# Patient Record
Sex: Male | Born: 1980 | Race: White | Hispanic: No | Marital: Married | Smoking: Never smoker
Health system: Southern US, Community
[De-identification: ages and names within clinical notes are randomized; demographics above are authoritative.]

## PROBLEM LIST (undated history)

## (undated) DIAGNOSIS — I10 Essential (primary) hypertension: Secondary | ICD-10-CM

## (undated) DIAGNOSIS — F988 Other specified behavioral and emotional disorders with onset usually occurring in childhood and adolescence: Secondary | ICD-10-CM

## (undated) DIAGNOSIS — E669 Obesity, unspecified: Secondary | ICD-10-CM

## (undated) HISTORY — DX: Other specified behavioral and emotional disorders with onset usually occurring in childhood and adolescence: F98.8

## (undated) HISTORY — DX: Obesity, unspecified: E66.9

## (undated) HISTORY — DX: Essential (primary) hypertension: I10

---
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
## 2006-03-11 ENCOUNTER — Encounter: Payer: Self-pay | Admitting: Emergency Medicine

## 2007-11-11 HISTORY — PX: ESOPHAGOGASTRODUODENOSCOPY ENDOSCOPY: SHX5814

## 2008-10-17 ENCOUNTER — Encounter: Payer: Self-pay | Admitting: Emergency Medicine

## 2008-10-17 ENCOUNTER — Ambulatory Visit: Payer: Self-pay | Admitting: Diagnostic Radiology

## 2008-10-17 ENCOUNTER — Emergency Department (HOSPITAL_COMMUNITY): Admission: EM | Admit: 2008-10-17 | Discharge: 2008-10-17 | Payer: Self-pay | Admitting: Emergency Medicine

## 2008-10-24 ENCOUNTER — Encounter: Admission: RE | Admit: 2008-10-24 | Discharge: 2008-10-24 | Payer: Self-pay | Admitting: Gastroenterology

## 2008-11-23 ENCOUNTER — Ambulatory Visit: Payer: Self-pay | Admitting: Internal Medicine

## 2008-11-23 DIAGNOSIS — I10 Essential (primary) hypertension: Secondary | ICD-10-CM

## 2008-11-23 DIAGNOSIS — F909 Attention-deficit hyperactivity disorder, unspecified type: Secondary | ICD-10-CM | POA: Insufficient documentation

## 2008-11-23 DIAGNOSIS — E669 Obesity, unspecified: Secondary | ICD-10-CM | POA: Insufficient documentation

## 2008-11-30 ENCOUNTER — Encounter (INDEPENDENT_AMBULATORY_CARE_PROVIDER_SITE_OTHER): Payer: Self-pay | Admitting: Gastroenterology

## 2008-11-30 ENCOUNTER — Ambulatory Visit (HOSPITAL_COMMUNITY): Admission: RE | Admit: 2008-11-30 | Discharge: 2008-11-30 | Payer: Self-pay | Admitting: Gastroenterology

## 2008-12-01 ENCOUNTER — Ambulatory Visit: Payer: Self-pay | Admitting: Internal Medicine

## 2008-12-01 LAB — CONVERTED CEMR LAB
ALT: 24 units/L (ref 0–53)
AST: 25 units/L (ref 0–37)
Albumin: 4.1 g/dL (ref 3.5–5.2)
Alkaline Phosphatase: 54 units/L (ref 39–117)
BUN: 12 mg/dL (ref 6–23)
Basophils Absolute: 0 10*3/uL (ref 0.0–0.1)
Basophils Relative: 0.5 % (ref 0.0–3.0)
Bilirubin, Direct: 0.1 mg/dL (ref 0.0–0.3)
CO2: 30 meq/L (ref 19–32)
CRP, High Sensitivity: 3 (ref 0.00–5.00)
Calcium: 9.7 mg/dL (ref 8.4–10.5)
Chloride: 105 meq/L (ref 96–112)
Cholesterol: 158 mg/dL (ref 0–200)
Creatinine, Ser: 1.1 mg/dL (ref 0.4–1.5)
Eosinophils Absolute: 0.2 10*3/uL (ref 0.0–0.7)
Eosinophils Relative: 3.2 % (ref 0.0–5.0)
GFR calc Af Amer: 105 mL/min
GFR calc non Af Amer: 87 mL/min
Glucose, Bld: 94 mg/dL (ref 70–99)
HCT: 44.3 % (ref 39.0–52.0)
HDL: 32.5 mg/dL — ABNORMAL LOW (ref >39.0)
Hemoglobin: 15.4 g/dL (ref 13.0–17.0)
LDL Cholesterol: 103 mg/dL — ABNORMAL HIGH (ref 0–99)
Lymphocytes Relative: 27.7 % (ref 12.0–46.0)
MCHC: 34.7 g/dL (ref 30.0–36.0)
MCV: 89.3 fL (ref 78.0–100.0)
Monocytes Absolute: 0.5 10*3/uL (ref 0.1–1.0)
Monocytes Relative: 8.1 % (ref 3.0–12.0)
Neutro Abs: 4.1 10*3/uL (ref 1.4–7.7)
Neutrophils Relative %: 60.5 % (ref 43.0–77.0)
Platelets: 194 10*3/uL (ref 150–400)
Potassium: 4.3 meq/L (ref 3.5–5.1)
RBC: 4.95 M/uL (ref 4.22–5.81)
RDW: 11.4 % — ABNORMAL LOW (ref 11.5–14.6)
Sodium: 140 meq/L (ref 135–145)
TSH: 0.56 microintl units/mL (ref 0.35–5.50)
Total Bilirubin: 0.7 mg/dL (ref 0.3–1.2)
Total CHOL/HDL Ratio: 4.9
Total Protein: 7.3 g/dL (ref 6.0–8.3)
Triglycerides: 115 mg/dL (ref 0–149)
VLDL: 23 mg/dL (ref 0–40)
WBC: 6.7 10*3/uL (ref 4.5–10.5)

## 2008-12-18 ENCOUNTER — Telehealth: Payer: Self-pay | Admitting: Internal Medicine

## 2008-12-19 ENCOUNTER — Telehealth: Payer: Self-pay | Admitting: Internal Medicine

## 2008-12-24 ENCOUNTER — Emergency Department (HOSPITAL_COMMUNITY): Admission: EM | Admit: 2008-12-24 | Discharge: 2008-12-24 | Payer: Self-pay | Admitting: Family Medicine

## 2009-02-21 ENCOUNTER — Ambulatory Visit: Payer: Self-pay | Admitting: Internal Medicine

## 2009-04-03 ENCOUNTER — Ambulatory Visit: Payer: Self-pay | Admitting: Internal Medicine

## 2009-04-03 ENCOUNTER — Ambulatory Visit: Payer: Self-pay | Admitting: Family Medicine

## 2009-04-03 DIAGNOSIS — J029 Acute pharyngitis, unspecified: Secondary | ICD-10-CM

## 2009-04-03 LAB — CONVERTED CEMR LAB
BUN: 16 mg/dL (ref 6–23)
CO2: 28 meq/L (ref 19–32)
Calcium: 9.6 mg/dL (ref 8.4–10.5)
Chloride: 111 meq/L (ref 96–112)
Creatinine, Ser: 1.1 mg/dL (ref 0.4–1.5)
GFR calc non Af Amer: 86.11 mL/min (ref >60)
Glucose, Bld: 102 mg/dL — ABNORMAL HIGH (ref 70–99)
Potassium: 4.3 meq/L (ref 3.5–5.1)
Rapid Strep: NEGATIVE
Sodium: 143 meq/L (ref 135–145)

## 2009-04-11 ENCOUNTER — Ambulatory Visit: Payer: Self-pay | Admitting: Internal Medicine

## 2009-10-02 ENCOUNTER — Ambulatory Visit: Payer: Self-pay | Admitting: Internal Medicine

## 2009-10-02 LAB — CONVERTED CEMR LAB
BUN: 19 mg/dL (ref 6–23)
CO2: 24 meq/L (ref 19–32)
Calcium: 9.6 mg/dL (ref 8.4–10.5)
Chloride: 105 meq/L (ref 96–112)
Cholesterol: 187 mg/dL (ref 0–200)
Creatinine, Ser: 1.11 mg/dL (ref 0.40–1.50)
Glucose, Bld: 90 mg/dL (ref 70–99)
HDL: 36 mg/dL — ABNORMAL LOW (ref >39)
LDL Cholesterol: 122 mg/dL — ABNORMAL HIGH (ref 0–99)
Potassium: 4.4 meq/L (ref 3.5–5.3)
Sodium: 141 meq/L (ref 135–145)
Total CHOL/HDL Ratio: 5.2
Triglycerides: 143 mg/dL (ref <150)
VLDL: 29 mg/dL (ref 0–40)

## 2009-10-11 ENCOUNTER — Ambulatory Visit: Payer: Self-pay | Admitting: Internal Medicine

## 2009-12-15 ENCOUNTER — Emergency Department (HOSPITAL_COMMUNITY): Admission: EM | Admit: 2009-12-15 | Discharge: 2009-12-15 | Payer: Self-pay | Admitting: Emergency Medicine

## 2010-02-08 IMAGING — CR DG NECK SOFT TISSUE
1 series · 1 of 1 positions shown · non-contrast
Comparison: None

CLINICAL DATA: Food  stuck in throat.

NECK SOFT TISSUES - 1+ VIEW

[w soft tissue neck]
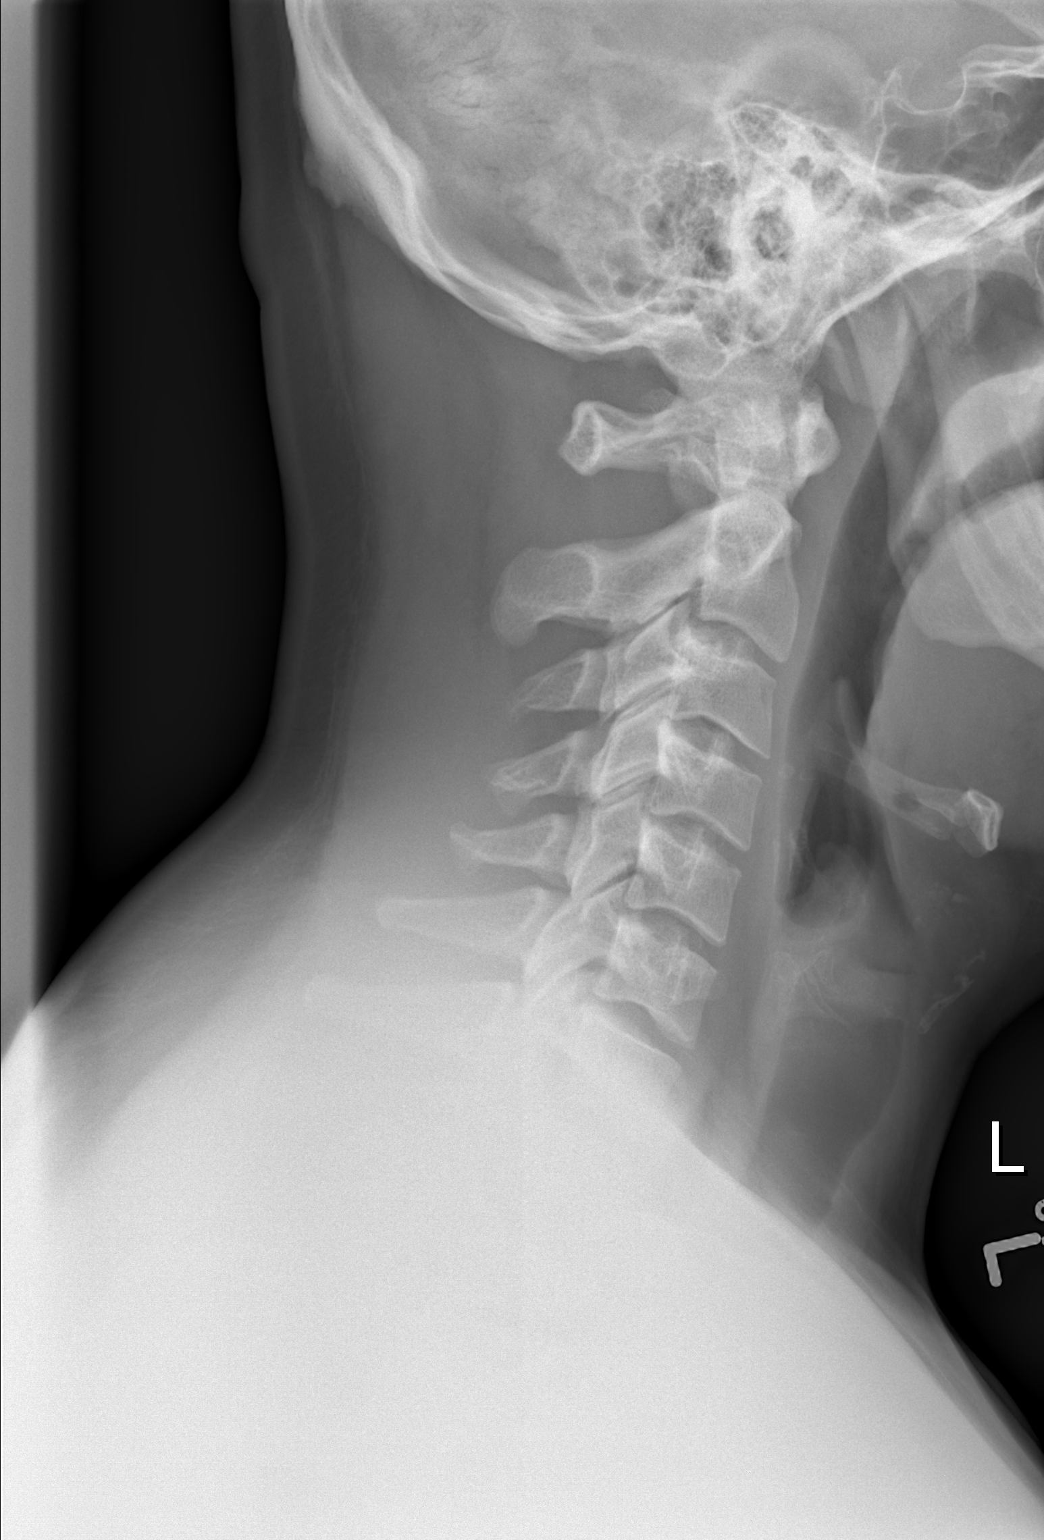

[1 of 1 positions shown; findings below may reference images not displayed]

FINDINGS: The cervical spine appears normal.  There is no
prevertebral soft tissue swelling.  The epiglottis is normal and
the airway is normal.  There is no radiopaque foreign body.
IMPRESSION: Negative

## 2010-02-22 ENCOUNTER — Ambulatory Visit: Payer: Self-pay | Admitting: Family Medicine

## 2010-02-22 LAB — CONVERTED CEMR LAB: Rapid Strep: NEGATIVE

## 2010-04-15 ENCOUNTER — Ambulatory Visit: Payer: Self-pay | Admitting: Internal Medicine

## 2010-04-15 LAB — CONVERTED CEMR LAB
BUN: 14 mg/dL (ref 6–23)
CO2: 25 meq/L (ref 19–32)
Calcium: 10 mg/dL (ref 8.4–10.5)
Chloride: 106 meq/L (ref 96–112)
Creatinine, Ser: 1.05 mg/dL (ref 0.40–1.50)
Glucose, Bld: 105 mg/dL — ABNORMAL HIGH (ref 70–99)
Potassium: 4.4 meq/L (ref 3.5–5.3)
Sodium: 141 meq/L (ref 135–145)

## 2010-04-16 ENCOUNTER — Encounter: Payer: Self-pay | Admitting: Internal Medicine

## 2010-07-25 ENCOUNTER — Ambulatory Visit: Payer: Self-pay | Admitting: Internal Medicine

## 2010-12-09 ENCOUNTER — Ambulatory Visit
Admission: RE | Admit: 2010-12-09 | Discharge: 2010-12-09 | Payer: Self-pay | Source: Home / Self Care | Attending: Family | Admitting: Family

## 2010-12-09 DIAGNOSIS — J020 Streptococcal pharyngitis: Secondary | ICD-10-CM | POA: Insufficient documentation

## 2010-12-10 NOTE — Letter (Signed)
   [place] [facility] [address] [place], [place]  [address]  [place] Phone: [phone]      April 16, 2010   Greg Santos [address] [place], [place] [address]  RE:  LAB RESULTS  Dear  [name],  The following is an interpretation of your most recent lab tests.  Please take note of any instructions provided or changes to medications that have resulted from your lab work.  ELECTROLYTES:  Good - no changes needed  KIDNEY FUNCTION TESTS:  Good - no changes needed         Sincerely Yours,    Dr. Thomos Lemons

## 2010-12-10 NOTE — Assessment & Plan Note (Signed)
Summary: 3 MONTH FOLLOW UP/MHF lm home and work # to [employer]-- Rm 3   Vital Signs:  Patient profile:   30 year old male Height:      73 inches Weight:      292.25 pounds BMI:     38.70 O2 Sat:      98 % on Room air Temp:     98.0 degrees F oral Pulse rate:   104 / minute Pulse rhythm:   regular Resp:     16 per minute BP sitting:   120 / 84  (left arm) Cuff size:   large  O2 Flow:  Room air CC: Rm  3  3 month f/u. Is Patient Diabetic? No  Does patient need assistance? Ambulation Normal Comments Needs refill on Lisinopril and Bupropion. [facility]. Nicki Guadalajara Fergerson CMA Duncan Dull)  July 25, 2010 12:05 PM    Primary Care Provider:  Dondra Spry DO  CC:  Rm  3  3 month f/u.Marland Kitchen  History of Present Illness: 30 y/o white male with hx of ADD for f/u good response to wellbutrin feels more stable.  less irritability no side effects noted.  denies insomnia  htn - stable  Preventive Screening-Counseling & Management  Alcohol-Tobacco     Smoking Status: never  Allergies: 1)  ! 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   Lungs:  normal respiratory effort and normal breath sounds.   Heart:  normal rate, regular rhythm, and no gallop.   Neurologic:  cranial nerves II-XII intact and gait normal.   Psych:  normally interactive, good eye contact, not anxious appearing, and not depressed appearing.     Impression & Recommendations:  Problem # 1:  ATTENTION DEFICIT HYPERACTIVITY DISORDER, ADULT (ICD-314.01) Assessment  Improved good response to wellbutrin.  Maintain current medication regimen.  Problem # 2:  OBESITY (ICD-278.00) Pt counseled on diet and exercise.  Ht: 73 (07/25/2010)   Wt: 292.25 (07/25/2010)   BMI: 38.70 (07/25/2010)  Complete Medication List: 1)  Prilosec Otc 20 Mg Tbec (Omeprazole magnesium) .... Take 1 tablet by mouth once a day 2)  Lisinopril 5 Mg Tabs (Lisinopril) .... One by mouth once daily 3)  Bupropion Hcl 150 Mg Xr24h-tab (Bupropion hcl) .... One by mouth once daily  Patient Instructions: 1)  Please schedule a follow-up appointment in 6 months. 2)  BMP prior to visit, ICD-9:  401.9 3)  Hepatic Panel prior to visit, ICD-9:  314.01 4)  Please return for lab work one (1) week before your next appointment.  Prescriptions: BUPROPION HCL 150 MG XR24H-TAB (BUPROPION HCL) one by mouth once daily  #90 x 1   Entered and Authorized by:   D. Thomos Lemons DO   Signed by:   D. Thomos Lemons DO on 07/25/2010   Method used:   [facility] [phone][facility] (retail)       [address]  [place], [place]  [address]       Ph: [phone]       Fax: [phone]   RxID:   [id number] LISINOPRIL 5 MG TABS (LISINOPRIL) one by mouth once daily  #90 x 1   Entered and Authorized by:   D. Thomos Lemons DO   Signed by:   D. Thomos Lemons DO on 07/25/2010   Method used:   [facility]        [facility] [phone]* (retail)       [address]       [place], [place]  [address]       Ph: [phone]       Fax: [phone]   RxID:   [id number]    Current Allergies (reviewed today): ! TETRACYCLINE

## 2010-12-10 NOTE — Assessment & Plan Note (Signed)
Summary: 6 month follow up/mhf   Vital Signs:  Patient profile:   30 year old male Height:      73 inches Weight:      287 pounds BMI:     38.00 O2 Sat:      97 % on Room air Temp:     97.7 degrees F oral Pulse rate:   80 / minute Pulse rhythm:   regular Resp:     18 per minute BP sitting:   118 / 80  (right arm) Cuff size:   large  Vitals Entered By: Glendell Docker CMA (April 15, 2010 8:03 AM)  O2 Flow:  Room air CC: Rm 2 - 6 Month Follow up Is Patient Diabetic? No   Primary Care Provider:  DThomos Lemons DO  CC:  Rm 2 - 6 Month Follow up.  History of Present Illness:  Hypertension Follow-Up      This is a 30 year old man who presents for Hypertension follow-up.  The patient denies lightheadedness and headaches.  The patient denies the following associated symptoms: chest pain.  Compliance with medications (by patient report) has been near 100%.  The patient reports that dietary compliance has been fair.  no dry cough.  he started wt watchers. lost a few pounds.  plans to continue   ADD - starting new job function.  attention is not major issue but he reports speech issues   Preventive Screening-Counseling & Management  Alcohol-Tobacco     Smoking Status: never  Allergies: 1)  ! 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   Mouth:  Oral mucosa and oropharynx without lesions or exudates.  Teeth in good repair. Lungs:  normal respiratory effort and normal breath sounds.   Heart:  normal  rate, regular rhythm, and no gallop.   Psych:  normally interactive, good eye contact, not anxious appearing, and not depressed appearing.     Impression & Recommendations:  Problem # 1:  HYPERTENSION (ICD-401.9) well controlled.  monitor BMET  His updated medication list for this problem includes:    Lisinopril 5 Mg Tabs (Lisinopril) ..... One by mouth once daily  Orders: T-Basic Metabolic Panel [id number])  BP today: 118/80 Prior BP: 140/80 (02/22/2010)  Labs Reviewed: K+: 4.4 (10/02/2009) Creat: : 1.11 (10/02/2009)   Chol: 187 (10/02/2009)   HDL: 36 (10/02/2009)   LDL: 122 (10/02/2009)   TG: 143 (10/02/2009)  Problem # 2:  ATTENTION DEFICIT HYPERACTIVITY DISORDER, ADULT (ICD-314.01) moving into new job function.  attention is not major issue but he notes some speech issues (stuttering). Trial of wellbutrin we discussed common side effects  Complete Medication List: 1)  Prilosec Otc 20 Mg Tbec (Omeprazole magnesium) .... Take 1 tablet by mouth once a day 2)  Lisinopril 5 Mg Tabs (Lisinopril) .... One by mouth once daily 3)  Bupropion Hcl 150 Mg Xr24h-tab (Bupropion hcl) .... One by mouth once daily  Patient Instructions: 1)  Please schedule a follow-up appointment  in 3 months. Prescriptions: LISINOPRIL 5 MG TABS (LISINOPRIL) one by mouth once daily  #30 x 5   Entered and Authorized by:   D. Thomos Lemons DO   Signed by:   D. Thomos Lemons DO on 04/15/2010   Method used:   [facility] [phone]* (retail)       [address]       [place], [place]  [address]       Ph: [phone]       Fax: [phone]   RxID:   [id number] BUPROPION HCL 150 MG XR24H-TAB (BUPROPION HCL) one by mouth once daily  #30 x 3   Entered and Authorized by:   D. Thomos Lemons DO   Signed by:   D. Thomos Lemons DO on 04/15/2010   Method used:   [facility] to        [facility] [phone]* (retail)       [address]        [place], [place]  [address]       Ph: [phone]       Fax: [phone]   RxID:   [id number]   Current Allergies (reviewed today): ! TETRACYCLINE

## 2010-12-10 NOTE — Assessment & Plan Note (Signed)
Summary: strep throat/mhf   Vital Signs:  Patient profile:   30 year old male Height:      73 inches Weight:      290 pounds Temp:     98.6 degrees F BP sitting:   140 / 80  Vitals Entered By: Shary Decamp (February 22, 2010 8:58 AM) CC: sore throat x 3 days   History of Present Illness: 30 yo man here today for ST.  sxs started 3 days ago.  reports throat feels swollen, saw white bumps.  no fever.  has been taking tylenol cold w/out relief, chloraseptic w/out relief.  today developed cough.  AM cough is productive, dry throughout the day.  + sick contacts.  no ear pain.  nasal congestion in AM.    Current Medications (verified): 1)  Prilosec Otc 20 Mg Tbec (Omeprazole Magnesium) .... Take 1 Tablet By Mouth Once A Day 2)  Lisinopril 5 Mg Tabs (Lisinopril) .... One By Mouth Once Daily  Allergies (verified): 1)  ! Tetracycline  Review of Systems      See HPI  Physical Exam  General:  alert, well-developed, and well-nourished.   Head:  normocephalic and atraumatic.  sinuses NTTP Eyes:  conjunctiva clear Ears:  EACs patent; TMs translucent and gray with good cone of light and bony landmarks.  Nose:  + turbinate edema Mouth:  + PND, +2 tonsilar enlargement w/ debris in the crypts, + erythema. Neck:  supple and no masses.   Lungs:  normal respiratory effort and normal breath sounds.   Heart:  normal rate, regular rhythm, and no gallop.     Impression & Recommendations:  Problem # 1:  SORE THROAT (ICD-462) Assessment Unchanged pt's strep (-).  sore throat is likely due to PND from virus/allergy combo.  stressed importance of regular OTC antihistamine use.  gave him sample of nasonex and instructions on use.  reviewed supportive care and red flags that should prompt return.  Pt expresses understanding and is in agreement w/ this plan. Orders: Rapid Strep (04540)  Complete Medication List: 1)  Prilosec Otc 20 Mg Tbec (Omeprazole magnesium) .... Take 1 tablet by mouth once a  day 2)  Lisinopril 5 Mg Tabs (Lisinopril) .... One by mouth once daily  Patient Instructions: 1)  This appears to be a virus/allergy combo 2)  Start taking your Claritin or Zyrtec daily during allergy season 3)  Use the nasal spray- 2 sprays each nostril- daily 4)  Drink plenty of fluids 5)  Mucinex will thin your congestion and drainage making it easier to cough up 6)  Ibuprofen for pain and inflammation 7)  Call with any questions or concerns 8)  Hang in there!  Laboratory Results    Other Tests  Rapid Strep: negative

## 2010-12-18 NOTE — Assessment & Plan Note (Signed)
Summary: sore swollen throat/mhf--rm 4   Vital Signs:  Patient profile:   30 year old male Height:      73 inches Weight:      310.50 pounds BMI:     41.11 Temp:     97.3 degrees F oral Pulse rate:   102 / minute Pulse rhythm:   regular Resp:     16 per minute BP sitting:   136 / 88  (right arm) Cuff size:   large  Vitals Entered By: Mervin Kung CMA Duncan Dull) (December 09, 2010 8:31 AM) CC: Pt states he has had a swollen and sore throat since last Wednesday. Is Patient Diabetic? No Pain Assessment Patient in pain? no      Comments Pt is also taking Zyrtec 1 once daily and agrees all other med doses and directions are correct. Mervin Kung CMA Duncan Dull)  December 09, 2010 8:37 AM    Primary Care Provider:  Dondra Spry DO  CC:  Pt states he has had a swollen and sore throat since last Wednesday.Marland Kitchen  History of Present Illness: [name] is a 30 year old male with cc of sore throat.  Started last wednesday.  Has improved slightly, but has not resolved.  Denies associated fever.  + Mucous dark green to light green.  Denies associated cough.  Energy is poor, slept a lot over the weekend.  Tried mucinex-d,  and tylenol with minimal relief.  Notes that his uvula feels very swollen and full in the morning and makes it hard to breath.    Allergies: 1)  ! Tetracycline  Past History:  Past Medical History: Last updated: 07/25/2010 ADD GERD  Childhood chickenpox   Hypertension   Past Surgical History: Last updated: 04/15/2010 Reconstructive left shoulder surgery 2003    Review of Systems       see HPI  Physical Exam  General:  Well-developed,well-nourished,in no acute distress; alert,appropriate and cooperative throughout examination Ears:  External ear exam shows no significant lesions or deformities.  Otoscopic examination reveals clear canals, tympanic membranes are intact bilaterally without bulging, retraction, inflammation or discharge. Hearing is grossly normal  bilaterally. Mouth:  difficulty visualizing tonsills due to narrow oropharynx.   Uvula appears moderately inflammed, but non- obstructive. Neck:  mild cervical LAD Lungs:  Normal respiratory effort, chest expands symmetrically. Lungs are clear to auscultation, no crackles or wheezes. Heart:  Normal rate and regular rhythm. S1 and S2 normal without gallop, murmur, click, rub or other extra sounds.   Impression & Recommendations:  Problem # 1:  PHARYNGITIS, STREPTOCOCCAL (ICD-034.0) Assessment New Will plan treatment with amoxicilin.  Will also treat with a short term round of prednisone to help with swelling of uvula and prevent airway compromise until acute infection is treated.  Pt notes that he has had several episodes of pharyngitis over the last few years.  He may be interested in ENT referral at some point, but declines at this time.   His updated medication list for this problem includes:    Amoxicillin 500 Mg Caps (Amoxicillin) .Marland Kitchen... 2 caps by mouth three times a day for 10 days  Complete Medication List: 1)  Prilosec Otc 20 Mg Tbec (Omeprazole magnesium) .... Take 1 tablet by mouth once a day 2)  Lisinopril 5 Mg Tabs (Lisinopril) .... One by mouth once daily 3)  Bupropion Hcl 150 Mg Xr24h-tab (Bupropion hcl) .... One by mouth once daily 4)  Amoxicillin 500 Mg Caps (Amoxicillin) .... 2 caps by mouth three  times a day for 10 days 5)  Prednisone 10 Mg Tabs (Prednisone) .... 4 tablets by mouth once daily for 4 days  Other Orders: Rapid Strep (04540)  Patient Instructions: 1)  Gargle twice daily with salt water. 2)  Take Tylenol 650mg  every 6 hours as needed for pain 3)  You may use over the counter Cepacol lozenges or Chloraseptic spray as needed for sore throat. 4)  Call if symptoms worsen or if they do not improve. 5)  Go to ER if you develop difficulty breathing. Prescriptions: PREDNISONE 10 MG TABS (PREDNISONE) 4 tablets by mouth once daily for 4 days  #16 x 0   Entered and  Authorized by:   Lemont Fillers FNP   Signed by:   Lemont Fillers FNP on 12/09/2010   Method used:   [facility]        [facility] [phone][facility] (retail)       [address]       [place], [place]  [address]       Ph: [phone]       Fax: [phone]   RxID:   [id number] AMOXICILLIN 500 MG CAPS (AMOXICILLIN) 2 caps by mouth three times a day for 10 days  #60 x 0   Entered and Authorized by:   Lemont Fillers FNP   Signed by:   Lemont Fillers FNP on 12/09/2010   Method used:   [facility]        [facility] [phone]* (retail)       [address]       [place], [place]  [address]       Ph: [phone]       Fax: [phone]   RxID:   [id number]    Orders Added: 1)  Rapid Strep [55732] 2)  Est. Patient Level III [20254]    Current Allergies (reviewed today): ! TETRACYCLINE  Laboratory Results   Date/Time Reported: Mervin Kung CMA (AAMA)  December 09, 2010 8:45 AM   Other Tests  Rapid Strep: positive  Kit Test Internal QC: Positive   (Normal Range: Negative)

## 2011-01-21 ENCOUNTER — Encounter: Payer: Self-pay | Admitting: Internal Medicine

## 2011-01-21 LAB — CONVERTED CEMR LAB
AST: 26 units/L (ref 0–37)
Alkaline Phosphatase: 54 units/L (ref 39–117)
Bilirubin, Direct: 0.1 mg/dL (ref 0.0–0.3)
Potassium: 4.2 meq/L (ref 3.5–5.3)
Sodium: 140 meq/L (ref 135–145)
Total Bilirubin: 0.4 mg/dL (ref 0.3–1.2)

## 2011-01-22 ENCOUNTER — Encounter: Payer: Self-pay | Admitting: Internal Medicine

## 2011-01-24 ENCOUNTER — Ambulatory Visit: Payer: Self-pay | Admitting: Internal Medicine

## 2011-01-28 ENCOUNTER — Encounter: Payer: Self-pay | Admitting: Internal Medicine

## 2011-01-28 ENCOUNTER — Ambulatory Visit (INDEPENDENT_AMBULATORY_CARE_PROVIDER_SITE_OTHER): Payer: 59 | Admitting: Internal Medicine

## 2011-01-28 DIAGNOSIS — F909 Attention-deficit hyperactivity disorder, unspecified type: Secondary | ICD-10-CM

## 2011-01-28 DIAGNOSIS — I10 Essential (primary) hypertension: Secondary | ICD-10-CM

## 2011-01-28 DIAGNOSIS — R000 Tachycardia, unspecified: Secondary | ICD-10-CM

## 2011-01-28 MED ORDER — BUPROPION HCL ER (XL) 150 MG PO TB24: 150.0000 mg | ORAL_TABLET | Freq: Every day | ORAL | Status: DC

## 2011-01-28 MED ORDER — BISOPROLOL FUMARATE 5 MG PO TABS: ORAL_TABLET | ORAL | Status: DC

## 2011-01-28 NOTE — Assessment & Plan Note (Signed)
Pt with asymptomatic sinus tachycardia EKG is benign DC lisinopril Use low dose bisoprolol Pt advised to monitor BP and pulse at home  B Blocker may also be helpful for anxiety symptoms

## 2011-01-28 NOTE — Progress Notes (Signed)
  Subjective:    Patient ID: Greg Santos, male    DOB: 1981-07-14, 30 y.o.   MRN: [id number]  HPI 30 y/o with PMHX of  ADD , anxiety and hypertension for follow up No interval hx. Overall doing well.    Nursing staff notes elevated heart rate.   Pt was not aware.   Prev HR has ranged from 70-110.   He denies dizziness or chest pain Denies hx of syncope.  He played football and basketball in HS. He denies use of decongestants.  Denies use of energy drinks.     Review of Systems  Constitutional: Negative for activity change.  Cardiovascular: Negative for chest pain and palpitations.  Psychiatric/Behavioral: Negative for behavioral problems and agitation.   Easily gets nervous at work     Objective:   Physical Exam  Constitutional: He appears well-developed and well-nourished. No distress.  HENT:  Head: Normocephalic.  Eyes: Pupils are equal, round, and reactive to light.  Neck: Normal range of motion. Neck supple.  Cardiovascular: Regular rhythm, S1 normal, S2 normal and normal pulses.  Tachycardia present.  Exam reveals no gallop, no S3 and no S4.   No murmur heard.      No murmur with handgrip or valsalva       EKG revieweed   Assessment & Plan:

## 2011-01-28 NOTE — Patient Instructions (Signed)
Monitor your blood pressure and pulse at home (2-3 x week) Bring BP and pulse log to your next appointment Call our office if your experience dizziness.

## 2011-01-28 NOTE — Assessment & Plan Note (Signed)
Unchanged Continue wellbutrin xr

## 2011-01-28 NOTE — Miscellaneous (Signed)
Summary: lab order  Clinical Lists Changes  Orders: Added new Test order of T-Basic Metabolic Panel [id number]) - Signed Added new Test order of T-Hepatic Function [phone]) - Signed

## 2011-01-29 LAB — POCT RAPID STREP A (OFFICE): Streptococcus, Group A Screen (Direct): NEGATIVE

## 2011-02-06 NOTE — Letter (Signed)
   [place] at [facility] [address] [place], [place]  [address]  [place] Phone: [phone]      January 26, 2011   Greg Santos [address] [place], [place] [address]  RE:  LAB RESULTS  Dear  Mr. Mccumbers,  The following is an interpretation of your most recent lab tests.  Please take note of any instructions provided or changes to medications that have resulted from your lab work.  ELECTROLYTES:  Good - no changes needed  KIDNEY FUNCTION TESTS:  Good - no changes needed  LIVER FUNCTION TESTS:  Good - no changes needed         Sincerely Yours,    Dr. Thomos Lemons  Appended Document:  mailed

## 2011-02-10 ENCOUNTER — Encounter: Payer: Self-pay | Admitting: Internal Medicine

## 2011-03-03 ENCOUNTER — Telehealth: Payer: Self-pay | Admitting: Internal Medicine

## 2011-03-03 NOTE — Telephone Encounter (Signed)
Refill- bupropion hcl xl 150mg  tablet. Take 1 tablet once daily. Qty 90.0 ea. Last fill 2.26.12

## 2011-03-03 NOTE — Telephone Encounter (Signed)
[facility] [place] [facility] and verified that pt just filled Rx on 02/26/11. She will discard request.

## 2011-03-14 ENCOUNTER — Encounter: Payer: Self-pay | Admitting: Family

## 2011-03-14 ENCOUNTER — Ambulatory Visit (INDEPENDENT_AMBULATORY_CARE_PROVIDER_SITE_OTHER): Payer: 59 | Admitting: Family

## 2011-03-14 DIAGNOSIS — L255 Unspecified contact dermatitis due to plants, except food: Secondary | ICD-10-CM

## 2011-03-14 DIAGNOSIS — L237 Allergic contact dermatitis due to plants, except food: Secondary | ICD-10-CM

## 2011-03-14 MED ORDER — CEPHALEXIN 500 MG PO CAPS: 500.0000 mg | ORAL_CAPSULE | Freq: Four times a day (QID) | ORAL | Status: AC

## 2011-03-14 NOTE — Progress Notes (Signed)
  Subjective:    Patient ID: Greg Santos, male    DOB: 08-09-1981, 30 y.o.   MRN: [id number]  HPI  [name] is a 30 yr old male who presents today with chief complaint of rash.  He reports that the rash started after he completed yard work on Sunday afternoon.  He notes that the rash is present on bilateral forearms as well as a few smaller spots on his legs. When he was performing yard work he was wearing gloves and a short sleeve shirt. He presents today due to a large blister which has formed on the affected area on his right forearm. Review of Systems Denies fever  Past Medical History  Diagnosis Date  . Hypertension   . Obesity   . ADD (attention deficit disorder)     History   Social History  . Marital Status: Married    Spouse Name: N/A    Number of Children: N/A  . Years of Education: N/A   Occupational History  . Not on file.   Social History Main Topics  . Smoking status: Never Smoker   . Smokeless tobacco: Not on file  . Alcohol Use: Not on file  . Drug Use: Not on file  . Sexually Active: Not on file   Other Topics Concern  . Not on file   Social History Narrative  . No narrative on file    No past surgical history on file.  Family History  Problem Relation Age of Onset  . Coronary artery disease      first degree relative    Allergies  Allergen Reactions  . Tetracycline     REACTION: Rash/Swelling    Current Outpatient Prescriptions on File Prior to Visit  Medication Sig Dispense Refill  . bisoprolol (ZEBETA) 5 MG tablet 1/2 tab by mouth once daily  30 tablet  3  . buPROPion (WELLBUTRIN XL) 150 MG 24 hr tablet Take 1 tablet (150 mg total) by mouth daily.  90 tablet  1  . omeprazole (PRILOSEC OTC) 20 MG tablet Take 20 mg by mouth daily.          BP 130/76  Pulse 66  Temp(Src) 98.6 F (37 C) (Oral)  Resp 16  Ht 6' 0.99" (1.854 m)  Wt 307 lb (139.254 kg)  BMI 40.51 kg/m2       Objective:   Physical Exam    general:  Overweight white male awake and alert and in no acute distress. Head: Normocephalic atraumatic Skin: Patient has a weeping rash noted on the left anterior forearm, also has erythema on right anterior forearm with a large approximately 2 inch diameter blister filled with serous drainage.  Assessment & Plan:

## 2011-03-14 NOTE — Patient Instructions (Signed)
Please keep area clean and dry and covered.  You should apply antibiotic ointment twice daily to area until healed.  Call if you develop increased redness, pain, drainage or fever over 100.

## 2011-03-14 NOTE — Assessment & Plan Note (Signed)
29 year old male presents today with poison ivy. He has now developed a large blister on the right forearm. There is some mild associated erythema surrounding the blister. After informed consent the area was prepped with Betadine and was drained using an 18-gauge needle. Serous drainage was drained from the area. Bilateral forearms were dressed with Telfa and Kling wrap. The patient was instructed to keep the area clean and dry and to apply antibiotic ointment twice daily. He will also be treated empirically with Keflex to prevent progression of right forearm and cellulitis. Patient is instructed to call if his symptoms worsen or do not improve.

## 2011-03-25 NOTE — Op Note (Signed)
NAME:  Greg Santos, Greg Santos             ACCOUNT NO.:  [id number]   MEDICAL RECORD NO.:  [id number]          PATIENT TYPE:  AMB   LOCATION:  ENDO                         FACILITY:  [facility]   PHYSICIAN:  Graylin Shiver, M.D.   DATE OF BIRTH:  1981/04/23   DATE OF PROCEDURE:  DATE OF DISCHARGE:                               OPERATIVE REPORT   INDICATIONS FOR PROCEDURE:  The patient is a 30 year old male who has  been experiencing intermittent dysphagia since high school.  He recently  went to the emergency room where food was stuck in his esophagus and it  finally dislodged spontaneously.  A barium esophagram was done, which  showed moderate-to-marked gastroesophageal reflux, a small hiatal  hernia, and fusiform narrowing in the mid to distal thoracic esophagus  with mild mucosal irregularity.  A barium tablet stopped in this area.   Informed consent was obtained after explanation of the risks of  bleeding, infection, and perforation.   PREMEDICATIONS:  Fentanyl 100 mcg IV, Versed 10 mg IV, Benadryl 25 mg  IV.   PROCEDURE:  With the patient in left lateral decubitus position, the  Pentax gastroscope was inserted into the oropharynx and passed into the  esophagus.  Immediately, upon entering the esophagus, I could see  concentric rings going all the way down the esophagus, which are often  seen in eosinophilic esophagitis.  There was no stricture in the  proximal or mid esophagus.  As the scope was advanced down to the distal  esophagus at the level of the esophagogastric junction, there was some  concentric narrowing, which almost looked like a Schatzki's ring.  The  patient did have a small hiatal hernia.  Just distal to the  esophagogastric junction, there was a small 5-mm ulcer, this was  biopsied.  The esophagogastric junction was not sufficiently narrowed to  impede passage of the scope.  The scope was advanced down into the  duodenum.  The second portion and bulb of the duodenum were  normal.  The  stomach mucosa looked normal.  The fundus and cardia looked normal.  The  scope was then brought back up into the esophagus.  Biopsies had been  obtained of the esophagogastric junction and ulcer area for histology.  I then obtained a couple of biopsies from the mid esophagus to look for  evidence of eosinophilic esophagitis.  After this, an endoscopic balloon  dilator was advanced down the scope and placed at the level of the  stenosis noted at the esophagogastric junction.  It was dilated to 13.5,  then 15 mm and held in place at each level for one minute.  He tolerated  the procedure well without complications.   IMPRESSION:  1. Rule out eosinophilic esophagitis.  2. Ulcer at the esophagogastric junction.  3. 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   PLAN:  I have recommended that the patient take Prilosec 20 mg daily.  The biopsies will be checked.  If he has eosinophilic esophagitis, this  will be treated, I will have the patient followup in the office.  ______________________________  Graylin Shiver, M.D.    SFG/MEDQ  D:  11/30/2008  T:  11/30/2008  Job:  [id number]

## 2011-03-31 ENCOUNTER — Ambulatory Visit: Payer: 59 | Admitting: Internal Medicine

## 2011-04-11 ENCOUNTER — Ambulatory Visit (INDEPENDENT_AMBULATORY_CARE_PROVIDER_SITE_OTHER): Payer: 59 | Admitting: Family

## 2011-04-11 ENCOUNTER — Encounter: Payer: Self-pay | Admitting: Family

## 2011-04-11 DIAGNOSIS — H6691 Otitis media, unspecified, right ear: Secondary | ICD-10-CM | POA: Insufficient documentation

## 2011-04-11 DIAGNOSIS — I10 Essential (primary) hypertension: Secondary | ICD-10-CM

## 2011-04-11 DIAGNOSIS — L237 Allergic contact dermatitis due to plants, except food: Secondary | ICD-10-CM

## 2011-04-11 DIAGNOSIS — H6690 Otitis media, unspecified, unspecified ear: Secondary | ICD-10-CM

## 2011-04-11 DIAGNOSIS — L255 Unspecified contact dermatitis due to plants, except food: Secondary | ICD-10-CM

## 2011-04-11 DIAGNOSIS — F909 Attention-deficit hyperactivity disorder, unspecified type: Secondary | ICD-10-CM

## 2011-04-11 MED ORDER — AMOXICILLIN 500 MG PO CAPS: 1000.0000 mg | ORAL_CAPSULE | Freq: Three times a day (TID) | ORAL | Status: AC

## 2011-04-11 NOTE — Patient Instructions (Signed)
Call if your symptoms worsen or if they do not improve.   

## 2011-04-11 NOTE — Assessment & Plan Note (Signed)
BP is up slightly today, however he reports much better blood pressures at home. Suspect element of white coat hypertension. Plan to recheck on next visit continue current dosing of bisoprolol.

## 2011-04-11 NOTE — Assessment & Plan Note (Signed)
Resolved

## 2011-04-11 NOTE — Assessment & Plan Note (Signed)
Plan to treat with amoxicillin. Patient instructed to contact us if symptoms worsen or do not improve.

## 2011-04-11 NOTE — Progress Notes (Signed)
  Subjective:    Patient ID: Greg Santos, male    DOB: 27-Mar-1981, 28 y.o.   MRN: [id number]  HPI HTN-  Reports home monitorin 115-125/75-85,  HR stable at home.  Reports that he is taking his bisoprolol, 1/2 tab daily. + compliance. Denies CP, swelling, SOB.  ADD- reports that this is well controlled on the wellbutrin.  Ears-  Went to [place] 2 weeks ago.  Worked long hours.  Reports that he had sore throat while he was there. That resolved. But hears ears remain "full."  When he swallows/yawnss ears "pop."   Review of Systems See history of present illness  Past Medical History  Diagnosis Date  . Hypertension   . Obesity   . ADD (attention deficit disorder)     History   Social History  . Marital Status: Married    Spouse Name: N/A    Number of Children: N/A  . Years of Education: N/A   Occupational History  . Not on file.   Social History Main Topics  . Smoking status: Never Smoker   . Smokeless tobacco: Not on file  . Alcohol Use: Not on file  . Drug Use: Not on file  . Sexually Active: Not on file   Other Topics Concern  . Not on file   Social History Narrative  . No narrative on file    No past surgical history on file.  Family History  Problem Relation Age of Onset  . Coronary artery disease      first degree relative    Allergies  Allergen Reactions  . Tetracycline     REACTION: Rash/Swelling    Current Outpatient Prescriptions on File Prior to Visit  Medication Sig Dispense Refill  . bisoprolol (ZEBETA) 5 MG tablet 1/2 tab by mouth once daily  30 tablet  3  . buPROPion (WELLBUTRIN XL) 150 MG 24 hr tablet Take 1 tablet (150 mg total) by mouth daily.  90 tablet  1  . omeprazole (PRILOSEC OTC) 20 MG tablet Take 20 mg by mouth daily.        Marland Kitchen DISCONTD: fexofenadine (ALLEGRA) 180 MG tablet Take 180 mg by mouth daily.          BP 144/96  Pulse 60  Temp(Src) 97.8 F (36.6 C) (Oral)  Resp 16  Ht 6' (1.829 m)  Wt 303 lb (137.44 kg)   BMI 41.09 kg/m2       Objective:   Physical Exam    general: Awake, alert, overweight male pleasant and in no acute distress ENT: Right tympanic membrane is pink mildly bulging, left tympanic membrane is normal. Oropharynx is clear without exudates or erythema Cardiovascular: S1, S2, regular rate and rhythm Respiratory: Without click auscultation bilaterally without wheezes rales or rhonchi Psychiatric: Awake alert oriented x3, pleasant    Assessment & Plan:

## 2011-04-11 NOTE — Assessment & Plan Note (Signed)
This is stable and will be turned continue same.

## 2011-04-14 ENCOUNTER — Ambulatory Visit: Payer: 59 | Admitting: Family

## 2011-04-21 ENCOUNTER — Telehealth: Payer: Self-pay | Admitting: *Deleted

## 2011-04-21 NOTE — Telephone Encounter (Signed)
Received call from pt that he still has popping in his ears from time to time. Denies pain or fullness of ears. Pt is flying out again tomorrow. Wanted to know if he should take another round of antibiotic or if there is something that will help his symptoms. Pt states he is in meetings all day and we can leave a detailed message on his voicemail. Please advise.

## 2011-04-21 NOTE — Telephone Encounter (Signed)
I would recommend that he chew gum while taking off and landing on air plane.  No need for further antibiotics if he is not having ear pain or fever.

## 2011-04-21 NOTE — Telephone Encounter (Signed)
Pt.notified

## 2011-07-13 ENCOUNTER — Other Ambulatory Visit: Payer: Self-pay | Admitting: Family

## 2011-07-16 ENCOUNTER — Ambulatory Visit: Payer: 59 | Admitting: Internal Medicine

## 2011-07-16 ENCOUNTER — Telehealth: Payer: Self-pay | Admitting: Internal Medicine

## 2011-07-16 ENCOUNTER — Ambulatory Visit (INDEPENDENT_AMBULATORY_CARE_PROVIDER_SITE_OTHER): Payer: 59 | Admitting: Internal Medicine

## 2011-07-16 ENCOUNTER — Encounter: Payer: Self-pay | Admitting: Internal Medicine

## 2011-07-16 DIAGNOSIS — F909 Attention-deficit hyperactivity disorder, unspecified type: Secondary | ICD-10-CM

## 2011-07-16 DIAGNOSIS — Z79899 Other long term (current) drug therapy: Secondary | ICD-10-CM

## 2011-07-16 DIAGNOSIS — I10 Essential (primary) hypertension: Secondary | ICD-10-CM

## 2011-07-16 DIAGNOSIS — Z13220 Encounter for screening for lipoid disorders: Secondary | ICD-10-CM

## 2011-07-16 MED ORDER — BUPROPION HCL ER (XL) 150 MG PO TB24: 150.0000 mg | ORAL_TABLET | ORAL | Status: DC

## 2011-07-16 MED ORDER — BISOPROLOL FUMARATE 5 MG PO TABS: 5.0000 mg | ORAL_TABLET | Freq: Every day | ORAL | Status: DC

## 2011-07-16 NOTE — Assessment & Plan Note (Signed)
Stable. Controlled with wellbutrin. rf wellbutrin and scheduled f/u.

## 2011-07-16 NOTE — Patient Instructions (Signed)
Please schedule lipid (chol scr) and chem7 (v58.69) prior to next appointment

## 2011-07-16 NOTE — Assessment & Plan Note (Signed)
Normotensive and stable. Continue current regimen. Monitor bp as outpt and followup in clinic as scheduled. Encouraged exercise and wt loss efforts. Obtain chem7 and lipid prior to next visit.

## 2011-07-16 NOTE — Telephone Encounter (Signed)
Lab orders entered for the last week of February 2013.

## 2011-07-16 NOTE — Progress Notes (Signed)
  Subjective:    Patient ID: Greg Santos, male    DOB: 02-15-1981, 30 y.o.   MRN: [id number]  HPI Pt presents to clinic for followup of multiple medical problems. H/o HTN well controlled with single agent without side effects. Home bp log 120's. Exercising regularly and has lost 12lbs since last visit. Last visit had right OM tx'ed with abx. Sx's resolved without recurrence. H/o ADD controlled with low dose wellbutrin. Can tell if doesn't take medication speech becomes more rapid. No other complaints.   Past Medical History  Diagnosis Date  . Hypertension   . Obesity   . ADD (attention deficit disorder)    No past surgical history on file.  reports that he has never smoked. He does not have any smokeless tobacco history on file. His alcohol and drug histories not on file. family history includes Coronary artery disease in an unspecified family member. Allergies  Allergen Reactions  . Tetracycline     REACTION: Rash/Swelling    Review of Systems see hpi     Objective:   Physical Exam  Physical Exam  Nursing note and vitals reviewed. Constitutional: Appears well-developed and well-nourished. No distress.  HENT: OP clear Head: Normocephalic and atraumatic.  Right Ear: External ear normal. TM and canal nl Left Ear: External ear normal. TM and canal nl Eyes: Conjunctivae are normal. No scleral icterus.  Neck: Neck supple.  Cardiovascular: Normal rate, regular rhythm and normal heart sounds.  Exam reveals no gallop and no friction rub.   No murmur heard. Pulmonary/Chest: Effort normal and breath sounds normal. No respiratory distress. He has no wheezes. no rales.  Lymphadenopathy:    He has no cervical adenopathy.  Neurological:Alert.  Skin: Skin is warm and dry. Not diaphoretic.  Psychiatric: Has a normal mood and affect.         Assessment & Plan:

## 2011-07-16 NOTE — Telephone Encounter (Signed)
Patient has a follow up for 01-12-2011 please fax order to lab for last week of February

## 2011-09-12 ENCOUNTER — Telehealth: Payer: Self-pay | Admitting: Internal Medicine

## 2011-09-12 NOTE — Telephone Encounter (Signed)
Refill bisoprolol fumarate 5 mg tab qty 30 last fill 08-14-2011  Take 1/2 tablet by mouth daily

## 2011-09-12 NOTE — Telephone Encounter (Signed)
Medication list states Bisoprolol is 1 tablet daily. Pt states he actually takes 1/2 tablet daily but is not currently needing a refill. Pt reports that Dr Rodena Medin is aware. Advised pt that when refill is needed, he should contact pharmacist as refill was sent on 07/16/11 #30 x 6 refills. Pt states his most recent pills do not look the same as his previous refill. Advised pt to take meds to pharmacy for verification.

## 2011-11-12 ENCOUNTER — Telehealth: Payer: Self-pay | Admitting: Internal Medicine

## 2011-11-12 NOTE — Telephone Encounter (Signed)
Per records, Rx was sent on 07/16/11 x 6 refills. Should still have refills remaining on file. Advised pt to speak with pharmacist re: previous rx. He will call me back if they do not have record.

## 2011-11-12 NOTE — Telephone Encounter (Signed)
BUPROPION  HCL XL 150 MG TAKE 1 PER DAY QTY 30 [facility] [address]

## 2012-01-12 ENCOUNTER — Ambulatory Visit (INDEPENDENT_AMBULATORY_CARE_PROVIDER_SITE_OTHER): Payer: 59 | Admitting: Internal Medicine

## 2012-01-12 ENCOUNTER — Encounter: Payer: Self-pay | Admitting: Internal Medicine

## 2012-01-12 DIAGNOSIS — Z79899 Other long term (current) drug therapy: Secondary | ICD-10-CM

## 2012-01-12 DIAGNOSIS — F909 Attention-deficit hyperactivity disorder, unspecified type: Secondary | ICD-10-CM

## 2012-01-12 DIAGNOSIS — I10 Essential (primary) hypertension: Secondary | ICD-10-CM

## 2012-01-12 DIAGNOSIS — Z13220 Encounter for screening for lipoid disorders: Secondary | ICD-10-CM

## 2012-01-12 DIAGNOSIS — E669 Obesity, unspecified: Secondary | ICD-10-CM

## 2012-01-12 LAB — BASIC METABOLIC PANEL
CO2: 27 mEq/L (ref 19–32)
Chloride: 104 mEq/L (ref 96–112)
Creat: 1.14 mg/dL (ref 0.50–1.35)
Potassium: 4.3 mEq/L (ref 3.5–5.3)

## 2012-01-12 LAB — LIPID PANEL
LDL Cholesterol: 125 mg/dL — ABNORMAL HIGH (ref 0–99)
Triglycerides: 106 mg/dL (ref <150)

## 2012-01-12 NOTE — Assessment & Plan Note (Signed)
Isolated elevation. Pt states WC HTN with nl results outside clinic. Continue current regimen with outpt bp monitoring.

## 2012-01-12 NOTE — Assessment & Plan Note (Signed)
Stable.   - Continue wellbutrin

## 2012-01-12 NOTE — Progress Notes (Signed)
  Subjective:    Patient ID: Greg Santos, male    DOB: 11-30-1980, 31 y.o.   MRN: [id number]  HPI Pt presents to clinic for followup of multiple medical problems. bp up in clinic but regular home monitoring remains normal. Yesterday bp 118/85. Gained 10lbs since last visit and attributes to stress of divorce. Increased eating out. No other complaints.  Past Medical History  Diagnosis Date  . Hypertension   . Obesity   . ADD (attention deficit disorder)    No past surgical history on file.  reports that he has never smoked. He has never used smokeless tobacco. He reports that he drinks alcohol. He reports that he does not use illicit drugs. family history includes Coronary artery disease in an unspecified family member. Allergies  Allergen Reactions  . Tetracycline     REACTION: Rash/Swelling      Review of Systems see hpi    Objective:   Physical Exam  Physical Exam  Nursing note and vitals reviewed. Constitutional: Appears well-developed and well-nourished. No distress.  HENT:  Head: Normocephalic and atraumatic.  Right Ear: External ear normal.  Left Ear: External ear normal.  Eyes: Conjunctivae are normal. No scleral icterus.  Neck: Neck supple. Carotid bruit is not present.  Cardiovascular: Normal rate, regular rhythm and normal heart sounds.  Exam reveals no gallop and no friction rub.   No murmur heard. Pulmonary/Chest: Effort normal and breath sounds normal. No respiratory distress. He has no wheezes. no rales.  Lymphadenopathy:    He has no cervical adenopathy.  Neurological:Alert.  Skin: Skin is warm and dry. Not diaphoretic.  Psychiatric: Has a normal mood and affect.        Assessment & Plan:

## 2012-01-12 NOTE — Assessment & Plan Note (Signed)
Recommend dietary modification, exercise and wt loss. Obtain chem7 and lipid.

## 2012-02-04 ENCOUNTER — Ambulatory Visit (INDEPENDENT_AMBULATORY_CARE_PROVIDER_SITE_OTHER): Payer: 59 | Admitting: Family

## 2012-02-04 ENCOUNTER — Encounter: Payer: Self-pay | Admitting: Family

## 2012-02-04 VITALS — BP 136/102 | HR 91 | Temp 98.6°F | Resp 16 | Wt 301.0 lb

## 2012-02-04 DIAGNOSIS — J029 Acute pharyngitis, unspecified: Secondary | ICD-10-CM

## 2012-02-04 MED ORDER — AMOXICILLIN 875 MG PO TABS: 875.0000 mg | ORAL_TABLET | Freq: Two times a day (BID) | ORAL | Status: AC

## 2012-02-04 NOTE — Patient Instructions (Signed)
Please call if your symptoms worsen, or if you are not feeling better in 2-3 days.  

## 2012-02-04 NOTE — Progress Notes (Signed)
  Subjective:    Patient ID: Greg Santos, male    DOB: 20-Jan-1981, 31 y.o.   MRN: [id number]  HPI  Mr.  Slatter is a 31 yr old male who presents today with chief complaint of sore throat.  He reports that his tonsills feel "swollen." Denies fever.  He reports yellow green thick nasal discharge x 24 hours.  He is planning to fly to [place] today to see a friend.    Review of Systems See HPI  Past Medical History  Diagnosis Date  . Hypertension   . Obesity   . ADD (attention deficit disorder)     History   Social History  . Marital Status: Married    Spouse Name: N/A    Number of Children: N/A  . Years of Education: N/A   Occupational History  . Not on file.   Social History Main Topics  . Smoking status: Never Smoker   . Smokeless tobacco: Never Used  . Alcohol Use: Yes  . Drug Use: No  . Sexually Active: Not on file   Other Topics Concern  . Not on file   Social History Narrative  . No narrative on file    No past surgical history on file.  Family History  Problem Relation Age of Onset  . Coronary artery disease      first degree relative    Allergies  Allergen Reactions  . Tetracycline     REACTION: Rash/Swelling    Current Outpatient Prescriptions on File Prior to Visit  Medication Sig Dispense Refill  . bisoprolol (ZEBETA) 5 MG tablet Take 1 tablet (5 mg total) by mouth daily.  30 tablet  6  . buPROPion (WELLBUTRIN XL) 150 MG 24 hr tablet Take 1 tablet (150 mg total) by mouth every morning.  30 tablet  6  . Loratadine (ALLERGY RELIEF REDI-TABS PO) Take 1 tablet by mouth daily as needed.        Marland Kitchen omeprazole (PRILOSEC OTC) 20 MG tablet Take 20 mg by mouth daily.          BP 136/102  Pulse 91  Temp(Src) 98.6 F (37 C) (Oral)  Resp 16  Wt 301 lb (136.533 kg)  SpO2 97%       Objective:   Physical Exam  Constitutional: He is oriented to person, place, and time. He appears well-developed and well-nourished. No distress.  HENT:    Head: Normocephalic and atraumatic.  Right Ear: Tympanic membrane and ear canal normal.  Left Ear: Tympanic membrane and ear canal normal.  Mouth/Throat: Posterior oropharyngeal erythema present. No posterior oropharyngeal edema.       Small amount of exudate noted on right tonsil.  Cardiovascular: Normal rate and regular rhythm.   No murmur heard. Pulmonary/Chest: Effort normal and breath sounds normal. No respiratory distress. He has no wheezes. He has no rales.  Neurological: He is alert and oriented to person, place, and time.  Skin: Skin is warm and dry.  Psychiatric: He has a normal mood and affect. His behavior is normal. Judgment and thought content normal.          Assessment & Plan:

## 2012-02-04 NOTE — Assessment & Plan Note (Signed)
Rapid strep is negative, but clinically concerning for strep.  He has had multiple episodes of strep in the past and is going out of town today. Will plan empiric rx with amoxicillin.

## 2012-04-01 ENCOUNTER — Ambulatory Visit: Payer: 59 | Admitting: Internal Medicine

## 2012-05-25 ENCOUNTER — Ambulatory Visit (INDEPENDENT_AMBULATORY_CARE_PROVIDER_SITE_OTHER): Payer: 59 | Admitting: Internal Medicine

## 2012-05-25 ENCOUNTER — Encounter: Payer: Self-pay | Admitting: Internal Medicine

## 2012-05-25 VITALS — BP 132/86 | HR 105 | Temp 97.8°F | Resp 18 | Wt 304.5 lb

## 2012-05-25 DIAGNOSIS — I10 Essential (primary) hypertension: Secondary | ICD-10-CM

## 2012-05-25 DIAGNOSIS — F909 Attention-deficit hyperactivity disorder, unspecified type: Secondary | ICD-10-CM

## 2012-05-25 MED ORDER — BUPROPION HCL ER (XL) 150 MG PO TB24: 150.0000 mg | ORAL_TABLET | ORAL | Status: DC

## 2012-05-25 MED ORDER — BISOPROLOL FUMARATE 5 MG PO TABS: 5.0000 mg | ORAL_TABLET | Freq: Every day | ORAL | Status: DC

## 2012-05-25 NOTE — Patient Instructions (Addendum)
Good luck with your move.  Show your new clinic our contact information so you can transfer records.

## 2012-05-25 NOTE — Assessment & Plan Note (Signed)
Normotensive and stable. Continue current regimen. Monitor bp as outpt and followup in clinic as scheduled. Rf's of medication provided. Plans to establish with PMD upon moving.

## 2012-05-25 NOTE — Assessment & Plan Note (Signed)
Stable. Maintained on low dose wellbutrin. Rf's provided.

## 2012-05-25 NOTE — Progress Notes (Signed)
  Subjective:    Patient ID: Greg Santos, male    DOB: 1981/06/27, 31 y.o.   MRN: [id number]  HPI Pt presents to clinic for followup of multiple medical problems. Plans on moving out of state to [place] in the near future. Known history of white coat hypertension. Recent home monitoring has been normotensive. Tolerating medication without adverse effect.   Past Medical History  Diagnosis Date  . Hypertension   . Obesity   . ADD (attention deficit disorder)    No past surgical history on file.  reports that he has never smoked. He has never used smokeless tobacco. He reports that he drinks alcohol. He reports that he does not use illicit drugs. family history includes Coronary artery disease in an unspecified family member. Allergies  Allergen Reactions  . Tetracycline     REACTION: Rash/Swelling      Review of Systems see hpi     Objective:   Physical Exam  Physical Exam  Nursing note and vitals reviewed. Constitutional: Appears well-developed and well-nourished. No distress.  HENT:  Head: Normocephalic and atraumatic.  Right Ear: External ear normal.  Left Ear: External ear normal.  Eyes: Conjunctivae are normal. No scleral icterus.  Neck: Neck supple. Carotid bruit is not present.  Cardiovascular: Normal rate, regular rhythm and normal heart sounds.  Exam reveals no gallop and no friction rub.   No murmur heard. Pulmonary/Chest: Effort normal and breath sounds normal. No respiratory distress. He has no wheezes. no rales.  Lymphadenopathy:    He has no cervical adenopathy.  Neurological:Alert.  Skin: Skin is warm and dry. Not diaphoretic.  Psychiatric: Has a normal mood and affect.        Assessment & Plan:

## 2012-06-15 ENCOUNTER — Ambulatory Visit: Payer: 59 | Admitting: Internal Medicine

## 2013-07-12 ENCOUNTER — Ambulatory Visit (INDEPENDENT_AMBULATORY_CARE_PROVIDER_SITE_OTHER): Payer: Managed Care, Other (non HMO) | Admitting: Physician Assistant

## 2013-07-12 ENCOUNTER — Encounter: Payer: Self-pay | Admitting: Physician Assistant

## 2013-07-12 VITALS — BP 134/102 | HR 83 | Temp 98.4°F | Resp 18 | Ht 72.0 in | Wt 301.5 lb

## 2013-07-12 DIAGNOSIS — R221 Localized swelling, mass and lump, neck: Secondary | ICD-10-CM

## 2013-07-12 DIAGNOSIS — R220 Localized swelling, mass and lump, head: Secondary | ICD-10-CM

## 2013-07-12 DIAGNOSIS — J029 Acute pharyngitis, unspecified: Secondary | ICD-10-CM

## 2013-07-12 MED ORDER — LIDOCAINE VISCOUS 2 % MT SOLN: 15.0000 mL | OROMUCOSAL | Status: DC | PRN

## 2013-07-12 NOTE — Assessment & Plan Note (Signed)
Rapid strep negative.  Patient declines culture due to cost and no insurance.  Rx viscous lidocaine for odynophagia.  Encourage rest/hydration.  Salt water gargles.  OTC Tylenol for pain relief.  Call or return to clinic if symptoms persist or worsen.

## 2013-07-12 NOTE — Progress Notes (Signed)
Patient ID: Greg Santos, male   DOB: 1980-11-29, 32 y.o.   MRN: [id number]  Patient presents to clinic today c/o sore throat x 4 days.  Information was obtained from the patient.  Patient states that he noticed a scratchy throat last Thursday night.  Upon awakening, he had a severely sore throat and he felt like his tonsils were swollen.  Denies fever, cough, rash, recent sick contacts, rhinorrhea, history of seasonal allergies, history of strep throat.  Does endorse significant odynophagia but no shortness of breath.  Has otherwise been feeling well.  Has tried tylenol sinus for pain and salt water gargles with some relief.  Past Medical History  Diagnosis Date  . Hypertension   . Obesity   . ADD (attention deficit disorder)    Current Outpatient Prescriptions on File Prior to Visit  Medication Sig Dispense Refill  . bisoprolol (ZEBETA) 5 MG tablet Take 1 tablet (5 mg total) by mouth daily.  30 tablet  5  . buPROPion (WELLBUTRIN XL) 150 MG 24 hr tablet Take 1 tablet (150 mg total) by mouth every morning.  30 tablet  5  . Loratadine (ALLERGY RELIEF REDI-TABS PO) Take 1 tablet by mouth daily as needed.        Marland Kitchen omeprazole (PRILOSEC OTC) 20 MG tablet Take 20 mg by mouth daily.         No current facility-administered medications on file prior to visit.    Allergies  Allergen Reactions  . Tetracycline     REACTION: Rash/Swelling    Family History  Problem Relation Age of Onset  . Coronary artery disease      first degree relative    History   Social History  . Marital Status: Married    Spouse Name: N/A    Number of Children: N/A  . Years of Education: N/A   Social History Main Topics  . Smoking status: Never Smoker   . Smokeless tobacco: Never Used  . Alcohol Use: Yes  . Drug Use: No  . Sexual Activity: None   Other Topics Concern  . None   Social History Narrative  . None   Review of Systems  Constitutional: Negative for fever, chills, weight loss and  malaise/fatigue.  HENT: Positive for sore throat. Negative for ear pain and congestion.   Respiratory: Negative for cough, sputum production, shortness of breath, wheezing and stridor.   Cardiovascular: Negative for chest pain.  Gastrointestinal: Negative for nausea, vomiting, abdominal pain, diarrhea and constipation.  Musculoskeletal: Negative for myalgias.  Skin: Negative for rash.  Neurological: Negative for headaches.  Endo/Heme/Allergies: Positive for environmental allergies.    Filed Vitals:   07/12/13 1135 07/12/13 1213  BP: 152/118 134/102  Pulse: 83   Temp: 98.4 F (36.9 C)   TempSrc: Oral   Resp: 18   Height: 6' (1.829 m)   Weight: 301 lb 8 oz (136.76 kg)   SpO2: 98%    Physical Exam  Vitals reviewed. Constitutional: He is oriented to person, place, and time and well-developed, well-nourished, and in no distress.  HENT:  Head: Normocephalic and atraumatic.  Right Ear: External ear normal.  Left Ear: External ear normal.  Nose: Nose normal.  TM WNL bilaterally. + posterior oropharyngeal erythema + 1mm mild ulceration of uvula No exudate present.  Eyes: Conjunctivae are normal. Pupils are equal, round, and reactive to light.  Neck: Normal range of motion. No thyromegaly present.  Cardiovascular: Normal rate, regular rhythm, normal heart sounds and intact distal pulses.  Pulmonary/Chest: Effort normal and breath sounds normal. No respiratory distress. He has no wheezes. He has no rales. He exhibits no tenderness.  Lymphadenopathy:    He has no cervical adenopathy.  Neurological: He is alert and oriented to person, place, and time.  Skin: Skin is warm and dry. No rash noted.   Recent Results (from the past 2160 hour(s))  POCT RAPID STREP A (OFFICE)     Status: None   Collection Time    07/12/13 11:48 AM      Result Value Range   Rapid Strep A Screen Negative  Negative   Assessment/Plan: Acute viral pharyngitis Rapid strep negative.  Patient declines  culture due to cost and no insurance.  Rx viscous lidocaine for odynophagia.  Encourage rest/hydration.  Salt water gargles.  OTC Tylenol for pain relief.  Call or return to clinic if symptoms persist or worsen.

## 2013-07-12 NOTE — Patient Instructions (Signed)
Please use lidocaine as needed for pain.  OTC tylenol for pain.  Continue salt water gargles and warm liquids.  Plenty of rest/hydration.  Please call or return to clinic if symptoms persist or worsen.  Viral Pharyngitis Viral pharyngitis is a viral infection that produces redness, pain, and swelling (inflammation) of the throat. It can spread from person to person (contagious). CAUSES Viral pharyngitis is caused by inhaling a large amount of certain germs called viruses. Many different viruses cause viral pharyngitis. SYMPTOMS Symptoms of viral pharyngitis include:  Sore throat.  Tiredness.  Stuffy nose.  Low-grade fever.  Congestion.  Cough. TREATMENT Treatment includes rest, drinking plenty of fluids, and the use of over-the-counter medication (approved by your caregiver). HOME CARE INSTRUCTIONS   Drink enough fluids to keep your urine clear or pale yellow.  Eat soft, cold foods such as ice cream, frozen ice pops, or gelatin dessert.  Gargle with warm salt water (1 tsp salt per 1 qt of water).  If over age 27, throat lozenges may be used safely.  Only take over-the-counter or prescription medicines for pain, discomfort, or fever as directed by your caregiver. Do not take aspirin. To help prevent spreading viral pharyngitis to others, avoid:  Mouth-to-mouth contact with others.  Sharing utensils for eating and drinking.  Coughing around others. SEEK MEDICAL CARE IF:   You are better in a few days, then become worse.  You have a fever or pain not helped by pain medicines.  There are any other changes that concern you. Document Released: 08/06/2005 Document Revised: 01/19/2012 Document Reviewed: 01/02/2011 [facility] Patient Information 2014 [place], [place].

## 2013-08-19 ENCOUNTER — Telehealth: Payer: Self-pay | Admitting: Physician Assistant

## 2013-08-19 NOTE — Telephone Encounter (Signed)
Attempt to reach pt via phone; no answer, no answering machine/SLS

## 2013-08-19 NOTE — Telephone Encounter (Signed)
Patient states that he thinks that he has strep throat but does not have any insurance. I explained to him that he would have to pay $145 upfront, per office policy. He wants to be seen only if we guarantee that he will get an antibiotic. I explained to him that if he did not need an antibiotic then the provider would not prescribe the antibiotic for him. He would like to know the symptoms of strep throat.

## 2013-08-22 ENCOUNTER — Ambulatory Visit (INDEPENDENT_AMBULATORY_CARE_PROVIDER_SITE_OTHER): Payer: Managed Care, Other (non HMO) | Admitting: Physician Assistant

## 2013-08-22 ENCOUNTER — Encounter: Payer: Self-pay | Admitting: Physician Assistant

## 2013-08-22 VITALS — BP 138/106 | HR 87 | Temp 98.6°F | Resp 18 | Ht 72.0 in | Wt 309.5 lb

## 2013-08-22 DIAGNOSIS — J019 Acute sinusitis, unspecified: Secondary | ICD-10-CM | POA: Insufficient documentation

## 2013-08-22 DIAGNOSIS — H6691 Otitis media, unspecified, right ear: Secondary | ICD-10-CM

## 2013-08-22 DIAGNOSIS — H6690 Otitis media, unspecified, unspecified ear: Secondary | ICD-10-CM

## 2013-08-22 MED ORDER — AMOXICILLIN-POT CLAVULANATE 875-125 MG PO TABS: 1.0000 | ORAL_TABLET | Freq: Two times a day (BID) | ORAL | Status: DC

## 2013-08-22 NOTE — Patient Instructions (Signed)
Please take medication as prescribed until all pills are gone.  Increase fluid intake.  Take a daily probiotic (Culturell, Align, Digestive Advantage).  Continue with daily zyrtec and mucinex.  Rest.  Humidifier in bedroom.  I don't think it would be a bad idea to see an ENT specialist once your insurance goes into effect.

## 2013-08-22 NOTE — Telephone Encounter (Signed)
OV 9:00a, Monday 10.13.14/SLS

## 2013-08-22 NOTE — Progress Notes (Signed)
Patient ID: Greg Santos, male   DOB: February 21, 1981, 32 y.o.   MRN: [id number]  Patient presents to clinic today c/o sinus pressure, sinus pain, right ear pain and sore throat over the past 1-1/2 weeks. Patient denies fever, chills, abdominal pain, nausea, vomiting, rash.  Endorses mild cough is nonproductive.  Has noticed ulcer on uvula.  Has history of chronically swollen tonsils.  Patient has tried Nettie pot, Mucinex, rest for his symptoms.  Patient is concerned because he has to travel for work soon, requiring him to get an airplane. He is worried because of the sinus pressure.   Past Medical History  Diagnosis Date  . Hypertension   . Obesity   . ADD (attention deficit disorder)     Current Outpatient Prescriptions on File Prior to Visit  Medication Sig Dispense Refill  . bisoprolol (ZEBETA) 5 MG tablet Take 1 tablet (5 mg total) by mouth daily.  30 tablet  5  . buPROPion (WELLBUTRIN XL) 150 MG 24 hr tablet Take 1 tablet (150 mg total) by mouth every morning.  30 tablet  5  . Loratadine (ALLERGY RELIEF REDI-TABS PO) Take 1 tablet by mouth daily as needed.        Marland Kitchen omeprazole (PRILOSEC OTC) 20 MG tablet Take 20 mg by mouth daily.         No current facility-administered medications on file prior to visit.    Allergies  Allergen Reactions  . Tetracycline     REACTION: Rash/Swelling    Family History  Problem Relation Age of Onset  . Coronary artery disease      first degree relative    History   Social History  . Marital Status: Married    Spouse Name: N/A    Number of Children: N/A  . Years of Education: N/A   Social History Main Topics  . Smoking status: Never Smoker   . Smokeless tobacco: Never Used  . Alcohol Use: Yes  . Drug Use: No  . Sexual Activity: None   Other Topics Concern  . None   Social History Narrative  . None   ROS See HPI.  All other ROS are negative.   Filed Vitals:   08/22/13 0911  BP: 138/106  Pulse: 87  Temp: 98.6 F (37 C)   Resp: 18    Physical Exam  Vitals reviewed. Constitutional: He is oriented to person, place, and time and well-developed, well-nourished, and in no distress.  HENT:  Head: Normocephalic and atraumatic.  Right Ear: External ear normal.  Left Ear: External ear normal.  Nose: Nose normal.  Mouth/Throat: Oropharynx is clear and moist. No oropharyngeal exudate.  Left tympanic membrane within normal limits.  Right tympanic membrane is dull, erythematous. Mild posterior oropharyngeal exudate with 2+ tonsillar adenopathy bilaterally. No evidence of tonsillar exudate on exam. There is presence of a small ulceration of uvula, without edema or shift. No palatal petechiae noted on exam.  Eyes: Conjunctivae are normal.  Neck: Neck supple.  Cardiovascular: Normal rate, regular rhythm and normal heart sounds.   Pulmonary/Chest: Effort normal and breath sounds normal. No respiratory distress. He has no wheezes. He has no rales. He exhibits no tenderness.  Lymphadenopathy:    He has no cervical adenopathy.  Neurological: He is alert and oriented to person, place, and time.  Skin: Skin is warm and dry. No rash noted.   Recent Results (from the past 2160 hour(s))  POCT RAPID STREP A (OFFICE)     Status: None  Collection Time    07/12/13 11:48 AM      Result Value Range   Rapid Strep A Screen Negative  Negative    Assessment/Plan: Acute sinusitis Concomitant otitis media, will Rx Augmentin.  Increase fluids. Rest. Humidifier. Saline nasal spray or an antibiotic. Daily Zyrtec. Over-the-counter Mucinex. Avoid products with decongestants due to history of hypertension.  Daily probiotic.  Acute otitis media Rx Augmentin.

## 2013-08-22 NOTE — Assessment & Plan Note (Signed)
Rx Augmentin

## 2013-08-22 NOTE — Assessment & Plan Note (Signed)
Concomitant otitis media, will Rx Augmentin.  Increase fluids. Rest. Humidifier. Saline nasal spray or an antibiotic. Daily Zyrtec. Over-the-counter Mucinex. Avoid products with decongestants due to history of hypertension.  Daily probiotic.

## 2013-11-07 ENCOUNTER — Encounter: Payer: Self-pay | Admitting: Physician Assistant

## 2013-11-07 ENCOUNTER — Telehealth: Payer: Self-pay | Admitting: Physician Assistant

## 2013-11-07 ENCOUNTER — Ambulatory Visit (INDEPENDENT_AMBULATORY_CARE_PROVIDER_SITE_OTHER): Payer: Managed Care, Other (non HMO) | Admitting: Physician Assistant

## 2013-11-07 VITALS — BP 132/100 | HR 114 | Temp 98.6°F | Resp 16 | Ht 72.0 in | Wt 313.8 lb

## 2013-11-07 DIAGNOSIS — F909 Attention-deficit hyperactivity disorder, unspecified type: Secondary | ICD-10-CM

## 2013-11-07 DIAGNOSIS — E669 Obesity, unspecified: Secondary | ICD-10-CM

## 2013-11-07 DIAGNOSIS — F419 Anxiety disorder, unspecified: Secondary | ICD-10-CM

## 2013-11-07 DIAGNOSIS — Z0000 Encounter for general adult medical examination without abnormal findings: Secondary | ICD-10-CM

## 2013-11-07 DIAGNOSIS — I10 Essential (primary) hypertension: Secondary | ICD-10-CM

## 2013-11-07 DIAGNOSIS — R000 Tachycardia, unspecified: Secondary | ICD-10-CM

## 2013-11-07 DIAGNOSIS — F411 Generalized anxiety disorder: Secondary | ICD-10-CM

## 2013-11-07 LAB — BASIC METABOLIC PANEL
BUN: 10 mg/dL (ref 6–23)
CO2: 27 mEq/L (ref 19–32)
Calcium: 9.4 mg/dL (ref 8.4–10.5)
Creat: 1.1 mg/dL (ref 0.50–1.35)
Glucose, Bld: 86 mg/dL (ref 70–99)

## 2013-11-07 LAB — CBC WITH DIFFERENTIAL/PLATELET
Eosinophils Absolute: 0.5 10*3/uL (ref 0.0–0.7)
Eosinophils Relative: 6 % — ABNORMAL HIGH (ref 0–5)
Hemoglobin: 14.9 g/dL (ref 13.0–17.0)
Lymphs Abs: 1.9 10*3/uL (ref 0.7–4.0)
MCH: 30.7 pg (ref 26.0–34.0)
MCV: 87.8 fL (ref 78.0–100.0)
Monocytes Absolute: 0.7 10*3/uL (ref 0.1–1.0)
Monocytes Relative: 8 % (ref 3–12)
RBC: 4.85 MIL/uL (ref 4.22–5.81)

## 2013-11-07 LAB — HEMOGLOBIN A1C
Hgb A1c MFr Bld: 5.5 % (ref <5.7)
Mean Plasma Glucose: 111 mg/dL (ref <117)

## 2013-11-07 LAB — HEPATIC FUNCTION PANEL
Albumin: 4.2 g/dL (ref 3.5–5.2)
Alkaline Phosphatase: 57 U/L (ref 39–117)
Total Protein: 6.8 g/dL (ref 6.0–8.3)

## 2013-11-07 LAB — LIPID PANEL
HDL: 35 mg/dL — ABNORMAL LOW (ref >39)
Total CHOL/HDL Ratio: 4.8 Ratio
VLDL: 28 mg/dL (ref 0–40)

## 2013-11-07 MED ORDER — BUPROPION HCL ER (XL) 150 MG PO TB24: 150.0000 mg | ORAL_TABLET | ORAL | Status: DC

## 2013-11-07 MED ORDER — BISOPROLOL FUMARATE 10 MG PO TABS: 10.0000 mg | ORAL_TABLET | Freq: Every day | ORAL | Status: DC

## 2013-11-07 NOTE — Progress Notes (Signed)
Patient ID: Greg Santos, male   DOB: 1981-07-05, 32 y.o.   MRN: [id number]  Patient presents to clinic today for annual exam.  Patient is fasting for labs.  Acute Concerns: Patient requesting refills of medication.  Chronic Issues: (1) Hypertension -- Patient currently on Zebeta. Denies chest pain, palpitations, shortness of breath, vision change or headache.  Patient has not taken medication this morning.  Pulse is elevated.  (2) ADHD -- Controlled with Wellbutrin.    (3) Obesity -- Body mass index is 42.54 kg/(m^2). Patient morbidly obese.  Patient endorses starting a crossfit gym in order to lose weight and get in shape.  Health Maintenance: Dental -- Overdue. Vision -- UTD Immunizations -- declines flu shot.  Tetanus due.  Declines today.  Past Medical History  Diagnosis Date  . Hypertension   . Obesity   . ADD (attention deficit disorder)     No past surgical history on file.  Current Outpatient Prescriptions on File Prior to Visit  Medication Sig Dispense Refill  . bisoprolol (ZEBETA) 5 MG tablet Take 1 tablet (5 mg total) by mouth daily.  30 tablet  5  . buPROPion (WELLBUTRIN XL) 150 MG 24 hr tablet Take 1 tablet (150 mg total) by mouth every morning.  30 tablet  5  . Loratadine (ALLERGY RELIEF REDI-TABS PO) Take 1 tablet by mouth daily as needed.        Marland Kitchen omeprazole (PRILOSEC OTC) 20 MG tablet Take 20 mg by mouth daily.         No current facility-administered medications on file prior to visit.    Allergies  Allergen Reactions  . Tetracycline     REACTION: Rash/Swelling    Family History  Problem Relation Age of Onset  . Coronary artery disease      first degree relative    History   Social History  . Marital Status: Married    Spouse Name: N/A    Number of Children: N/A  . Years of Education: N/A   Occupational History  . Not on file.   Social History Main Topics  . Smoking status: Never Smoker   . Smokeless tobacco: Never Used  . Alcohol  Use: Yes  . Drug Use: No  . Sexual Activity: Not on file   Other Topics Concern  . Not on file   Social History Narrative  . No narrative on file   Review of Systems  Constitutional: Negative for fever and weight loss.  HENT: Negative for ear discharge, ear pain, hearing loss and tinnitus.   Eyes: Negative for blurred vision, double vision, photophobia and pain.  Respiratory: Negative for cough, shortness of breath and wheezing.   Cardiovascular: Negative for chest pain and palpitations.  Gastrointestinal: Positive for heartburn. Negative for nausea, vomiting, abdominal pain, diarrhea, constipation, blood in stool and melena.  Genitourinary: Negative for dysuria, urgency, frequency, hematuria and flank pain.       Nocturia x 0  Neurological: Negative for dizziness, seizures, loss of consciousness and headaches.  Endo/Heme/Allergies: Positive for environmental allergies.  Psychiatric/Behavioral: Negative for depression, suicidal ideas, hallucinations and substance abuse. The patient is nervous/anxious. The patient does not have insomnia.    BP 132/100  Pulse 114  Temp(Src) 98.6 F (37 C) (Oral)  Resp 16  Ht 6' (1.829 m)  Wt 313 lb 12 oz (142.316 kg)  BMI 42.54 kg/m2  SpO2 98%  Physical Exam  Vitals reviewed. Constitutional: He is oriented to person, place, and time.  Obese caucasian  male in no acute distress  HENT:  Head: Normocephalic and atraumatic.  Right Ear: External ear normal.  Left Ear: External ear normal.  Nose: Nose normal.  Mouth/Throat: No oropharyngeal exudate.  TM within normal limits bilaterally.  Presence of an aphthous ulcer on tongue.  No other ulceration noted.  Uvula midline.  Eyes: Conjunctivae and EOM are normal. Pupils are equal, round, and reactive to light.  Neck: Normal range of motion. Neck supple.  Cardiovascular: Normal rate, regular rhythm, normal heart sounds and intact distal pulses.   Pulmonary/Chest: Effort normal and breath sounds  normal. No respiratory distress. He has no wheezes. He has no rales. He exhibits no tenderness.  Abdominal: Soft. Bowel sounds are normal. He exhibits no distension and no mass. There is no tenderness. There is no rebound and no guarding. No hernia.  Genitourinary: Testes/scrotum normal and penis normal. No discharge found.  Lymphadenopathy:    He has no cervical adenopathy.  Neurological: He is alert and oriented to person, place, and time.  Skin: Skin is warm and dry. No rash noted.  Psychiatric: Affect normal.   Assessment/Plan: No problem-specific assessment & plan notes found for this encounter.

## 2013-11-07 NOTE — Patient Instructions (Signed)
Please obtain labs.  I will call you with your results.  Please start 10 mg of the Zebeta.  Return to clinic in ~ 2 weeks for Blood pressure recheck.  Read information below on DASH diet.  DASH Diet The DASH diet stands for "Dietary Approaches to Stop Hypertension." It is a healthy eating plan that has been shown to reduce high blood pressure (hypertension) in as little as 14 days, while also possibly providing other significant health benefits. These other health benefits include reducing the risk of breast cancer after menopause and reducing the risk of type 2 diabetes, heart disease, colon cancer, and stroke. Health benefits also include weight loss and slowing kidney failure in patients with chronic kidney disease.  DIET GUIDELINES  Limit salt (sodium). Your diet should contain less than 1500 mg of sodium daily.  Limit refined or processed carbohydrates. Your diet should include mostly whole grains. Desserts and added sugars should be used sparingly.  Include small amounts of heart-healthy fats. These types of fats include nuts, oils, and tub margarine. Limit saturated and trans fats. These fats have been shown to be harmful in the body. CHOOSING FOODS  The following food groups are based on a 2000 calorie diet. See your Registered Dietitian for individual calorie needs. Grains and Grain Products (6 to 8 servings daily)  Eat More Often: Whole-wheat bread, brown rice, whole-grain or wheat pasta, quinoa, popcorn without added fat or salt (air popped).  Eat Less Often: White bread, white pasta, white rice, cornbread. Vegetables (4 to 5 servings daily)  Eat More Often: Fresh, frozen, and canned vegetables. Vegetables may be raw, steamed, roasted, or grilled with a minimal amount of fat.  Eat Less Often/Avoid: Creamed or fried vegetables. Vegetables in a cheese sauce. Fruit (4 to 5 servings daily)  Eat More Often: All fresh, canned (in natural juice), or frozen fruits. Dried fruits without  added sugar. One hundred percent fruit juice ( cup [237 mL] daily).  Eat Less Often: Dried fruits with added sugar. Canned fruit in light or heavy syrup. [employer], Fish, and Poultry (2 servings or less daily. One serving is 3 to 4 oz [85-114 g]).  Eat More Often: Ninety percent or leaner ground beef, tenderloin, sirloin. Round cuts of beef, chicken breast, [place] breast. All fish. Grill, bake, or broil your meat. Nothing should be fried.  Eat Less Often/Avoid: Fatty cuts of meat, [place], or chicken leg, thigh, or wing. Fried cuts of meat or fish. Dairy (2 to 3 servings)  Eat More Often: Low-fat or fat-free milk, low-fat plain or light yogurt, reduced-fat or part-skim cheese.  Eat Less Often/Avoid: Milk (whole, 2%).Whole milk yogurt. Full-fat cheeses. Nuts, Seeds, and Legumes (4 to 5 servings per week)  Eat More Often: All without added salt.  Eat Less Often/Avoid: Salted nuts and seeds, canned beans with added salt. Fats and Sweets (limited)  Eat More Often: Vegetable oils, tub margarines without trans fats, sugar-free gelatin. Mayonnaise and salad dressings.  Eat Less Often/Avoid: Coconut oils, palm oils, butter, stick margarine, cream, half and half, cookies, candy, pie. FOR MORE INFORMATION The Dash Diet Eating Plan: www.dashdiet.org Document Released: 10/16/2011 Document Revised: 01/19/2012 Document Reviewed: 10/16/2011 [facility] Patient Information 2014 [place], [place].

## 2013-11-07 NOTE — Progress Notes (Signed)
Pre visit review using our clinic review tool, if applicable. No additional management support is needed unless otherwise documented below in the visit note/SLS  

## 2013-11-07 NOTE — Telephone Encounter (Signed)
HIS CPE TODAY WAS FOR HIS INSURANCE AT WORK.  WAS HE TESTED FOR NICOTINE.  IF NOT HE NEEDS TO BE FOR HIS INSURANCE

## 2013-11-07 NOTE — Telephone Encounter (Signed)
I have added this to labs drawn today.  We will check to make sure it did not have to be drawn in a special tube.

## 2013-11-08 LAB — URINALYSIS, ROUTINE W REFLEX MICROSCOPIC
Bilirubin Urine: NEGATIVE
Glucose, UA: NEGATIVE mg/dL
Hgb urine dipstick: NEGATIVE
Ketones, ur: NEGATIVE mg/dL
Leukocytes, UA: NEGATIVE
Protein, ur: NEGATIVE mg/dL
pH: 6 (ref 5.0–8.0)

## 2013-11-08 NOTE — Telephone Encounter (Signed)
Spoke with [place] at [facility]. She states test indicated it is still waiting on specimen. She attempted to link this order with previous lab order from 11/07/13 then checked specimen requirements. States specimen must be frozen. Notified pt that specimen will have to be redrawn. He will return to lab tomorrow morning. Test re-ordered.

## 2013-11-11 LAB — NICOTINE/COTININE METABOLITES: Cotinine: <10 ng/mL

## 2013-11-13 DIAGNOSIS — Z0000 Encounter for general adult medical examination without abnormal findings: Secondary | ICD-10-CM | POA: Insufficient documentation

## 2013-11-13 NOTE — Assessment & Plan Note (Signed)
Continue medications as prescribed.

## 2013-11-13 NOTE — Assessment & Plan Note (Signed)
Continue current regimen

## 2013-11-13 NOTE — Assessment & Plan Note (Signed)
History updated.  Will obtain fasting labs.

## 2013-11-13 NOTE — Assessment & Plan Note (Signed)
Encourage diet and weight loss.  Patient to start a crossfit class.  Instructed patient on importance of taking Zebeta as prescribed.

## 2013-11-14 ENCOUNTER — Telehealth: Payer: Self-pay | Admitting: Physician Assistant

## 2013-11-14 NOTE — Telephone Encounter (Signed)
Spoke with pt & explained that he cannot Email health information documents d/t HIPAA laws but can fax the Form and we will complete & fax to Employer, will then mail original back to pt at verified home address. Pt agreed & will fax form when he has completed his portion/SLS

## 2013-11-14 NOTE — Telephone Encounter (Signed)
HAD A CPE AND NEEDS A FORM FILLED OUT FOR HIS COMPANY.  HE IS IN [place] FOR WORK AND WOULD LIKE TO E MAIL THE FORM.

## 2013-11-16 NOTE — Telephone Encounter (Signed)
Paperwork received , completed & faxed to Employer at [phone]; Original copy mailed to patient/SLS

## 2013-11-23 ENCOUNTER — Telehealth: Payer: Self-pay | Admitting: *Deleted

## 2013-11-23 NOTE — Telephone Encounter (Signed)
Received message from pt wanting to know status of paperwork for his insurance and nicotine testing.  Notified pt that form and nicotine results were faxed back on 11/16/13 and original copy was mailed to him per 11/16/13 phone note.

## 2013-11-28 ENCOUNTER — Ambulatory Visit (INDEPENDENT_AMBULATORY_CARE_PROVIDER_SITE_OTHER): Payer: 59 | Admitting: Physician Assistant

## 2013-11-28 ENCOUNTER — Encounter: Payer: Self-pay | Admitting: Physician Assistant

## 2013-11-28 VITALS — BP 118/78 | HR 97 | Temp 98.1°F | Resp 16 | Ht 72.0 in | Wt 310.5 lb

## 2013-11-28 DIAGNOSIS — I10 Essential (primary) hypertension: Secondary | ICD-10-CM

## 2013-11-28 NOTE — Patient Instructions (Signed)
Your blood pressure looked good today.  Continue Zebeta as prescribed.  Try to exercise 30-minutes/day, at least 3 days a week.  Please return to clinic when you need us.  Otherwise follow-up in 6 months.

## 2013-11-28 NOTE — Progress Notes (Signed)
Pre visit review using our clinic review tool, if applicable. No additional management support is needed unless otherwise documented below in the visit note/SLS  

## 2013-11-28 NOTE — Progress Notes (Signed)
Patient presents to clinic today for 3-week follow-up of HTN.  Patient's BP was elevated at 132/100 last visit.  Patient had not taken his Zebeta that morning before his appointment.  Patient instructed to return for BP recheck while on medication.  Patient's BP is 118/78 in clinic today.  Denies chest pain, palpitations, vision changes, headache, LH or dizziness.  Patient did take his medication this am.  Patient expresses no acute concerns at today's visit.    Past Medical History  Diagnosis Date  . Hypertension   . Obesity   . ADD (attention deficit disorder)     Current Outpatient Prescriptions on File Prior to Visit  Medication Sig Dispense Refill  . bisoprolol (ZEBETA) 10 MG tablet Take 1 tablet (10 mg total) by mouth daily.  90 tablet  1  . buPROPion (WELLBUTRIN XL) 150 MG 24 hr tablet Take 1 tablet (150 mg total) by mouth every morning.  90 tablet  3  . Loratadine (ALLERGY RELIEF REDI-TABS PO) Take 1 tablet by mouth daily as needed.        Marland Kitchen omeprazole (PRILOSEC OTC) 20 MG tablet Take 20 mg by mouth daily.        . Probiotic Product (PROBIOTIC DAILY) CAPS Take by mouth daily.       No current facility-administered medications on file prior to visit.    Allergies  Allergen Reactions  . Tetracycline     REACTION: Rash/Swelling    Family History  Problem Relation Age of Onset  . Coronary artery disease      first degree relative    History   Social History  . Marital Status: Married    Spouse Name: N/A    Number of Children: N/A  . Years of Education: N/A   Social History Main Topics  . Smoking status: Never Smoker   . Smokeless tobacco: Never Used  . Alcohol Use: Yes  . Drug Use: No  . Sexual Activity: None   Other Topics Concern  . None   Social History Narrative  . None   Review of Systems - See HPI.  All other ROS are negative.  Filed Vitals:   11/28/13 0910  BP: 118/78  Pulse: 97  Temp: 98.1 F (36.7 C)  Resp: 16    Physical Exam  Vitals  reviewed. Constitutional: He is oriented to person, place, and time and well-developed, well-nourished, and in no distress.  HENT:  Head: Normocephalic and atraumatic.  Eyes: Conjunctivae are normal. Pupils are equal, round, and reactive to light.  Neck: Neck supple.  Cardiovascular: Normal rate, regular rhythm and normal heart sounds.   Pulmonary/Chest: Effort normal and breath sounds normal.  Neurological: He is alert and oriented to person, place, and time.  Skin: Skin is warm and dry. No rash noted.  Psychiatric: Affect normal.    Recent Results (from the past 2160 hour(s))  CBC WITH DIFFERENTIAL     Status: Abnormal   Collection Time    11/07/13 10:36 AM      Result Value Range   WBC 8.3  4.0 - 10.5 K/uL   RBC 4.85  4.22 - 5.81 MIL/uL   Hemoglobin 14.9  13.0 - 17.0 g/dL   HCT 42.6  39.0 - 52.0 %   MCV 87.8  78.0 - 100.0 fL   MCH 30.7  26.0 - 34.0 pg   MCHC 35.0  30.0 - 36.0 g/dL   RDW 13.3  11.5 - 15.5 %   Platelets 245  150 -  400 K/uL   Neutrophils Relative % 61  43 - 77 %   Neutro Abs 5.1  1.7 - 7.7 K/uL   Lymphocytes Relative 24  12 - 46 %   Lymphs Abs 1.9  0.7 - 4.0 K/uL   Monocytes Relative 8  3 - 12 %   Monocytes Absolute 0.7  0.1 - 1.0 K/uL   Eosinophils Relative 6 (*) 0 - 5 %   Eosinophils Absolute 0.5  0.0 - 0.7 K/uL   Basophils Relative 1  0 - 1 %   Basophils Absolute 0.1  0.0 - 0.1 K/uL   Smear Review Criteria for review not met    BASIC METABOLIC PANEL     Status: None   Collection Time    11/07/13 10:36 AM      Result Value Range   Sodium 140  135 - 145 mEq/L   Potassium 4.3  3.5 - 5.3 mEq/L   Chloride 103  96 - 112 mEq/L   CO2 27  19 - 32 mEq/L   Glucose, Bld 86  70 - 99 mg/dL   BUN 10  6 - 23 mg/dL   Creat 1.10  0.50 - 1.35 mg/dL   Calcium 9.4  8.4 - 10.5 mg/dL  HEPATIC FUNCTION PANEL     Status: None   Collection Time    11/07/13 10:36 AM      Result Value Range   Total Bilirubin 0.6  0.3 - 1.2 mg/dL   Bilirubin, Direct 0.1  0.0 - 0.3 mg/dL    Indirect Bilirubin 0.5  0.0 - 0.9 mg/dL   Alkaline Phosphatase 57  39 - 117 U/L   AST 28  0 - 37 U/L   ALT 41  0 - 53 U/L   Total Protein 6.8  6.0 - 8.3 g/dL   Albumin 4.2  3.5 - 5.2 g/dL  TSH     Status: None   Collection Time    11/07/13 10:36 AM      Result Value Range   TSH 0.909  0.350 - 4.500 uIU/mL  HEMOGLOBIN A1C     Status: None   Collection Time    11/07/13 10:36 AM      Result Value Range   Hemoglobin A1C 5.5  <5.7 %   Comment:                                                                            According to the ADA Clinical Practice Recommendations for 2011, when     HbA1c is used as a screening test:             >=6.5%   Diagnostic of Diabetes Mellitus                (if abnormal result is confirmed)           5.7-6.4%   Increased risk of developing Diabetes Mellitus           References:Diagnosis and Classification of Diabetes Mellitus,Diabetes     OMVE,7209,47(SJGGE 1):S62-S69 and Standards of Medical Care in             Diabetes - 2011,Diabetes Care,2011,34 (Suppl 1):S11-S61.  Mean Plasma Glucose 111  <117 mg/dL  URINALYSIS, ROUTINE W REFLEX MICROSCOPIC     Status: None   Collection Time    11/07/13 10:36 AM      Result Value Range   Color, Urine YELLOW  YELLOW   APPearance CLEAR  CLEAR   Specific Gravity, Urine 1.013  1.005 - 1.030   pH 6.0  5.0 - 8.0   Glucose, UA NEG  NEG mg/dL   Bilirubin Urine NEG  NEG   Ketones, ur NEG  NEG mg/dL   Hgb urine dipstick NEG  NEG   Protein, ur NEG  NEG mg/dL   Urobilinogen, UA 0.2  0.0 - 1.0 mg/dL   Nitrite NEG  NEG   Leukocytes, UA NEG  NEG  LIPID PANEL     Status: Abnormal   Collection Time    11/07/13 10:36 AM      Result Value Range   Cholesterol 169  0 - 200 mg/dL   Comment: ATP III Classification:           < 200        mg/dL        Desirable          200 - 239     mg/dL        Borderline High          >= 240        mg/dL        High         Triglycerides 138  <150 mg/dL   HDL 35 (*) >39  mg/dL   Total CHOL/HDL Ratio 4.8     VLDL 28  0 - 40 mg/dL   LDL Cholesterol 106 (*) 0 - 99 mg/dL   Comment:       Total Cholesterol/HDL Ratio:CHD Risk                            Coronary Heart Disease Risk Table                                            Men       Women              1/2 Average Risk              3.4        3.3                  Average Risk              5.0        4.4               2X Average Risk              9.6        7.1               3X Average Risk             23.4       11.0     Use the calculated Patient Ratio above and the CHD Risk table      to determine the patient's CHD Risk.     ATP III Classification (LDL):           < 100  mg/dL         Optimal          100 - 129     mg/dL         Near or Above Optimal          130 - 159     mg/dL         Borderline High          160 - 189     mg/dL         High           > 190        mg/dL         Very High        NICOTINE/COTININE METABOLITES     Status: None   Collection Time    11/08/13  1:54 PM      Result Value Range   Cotinine <10     Comment:       Suggested reference range:  >25 ng/mL for Smokers          Assessment/Plan: No problem-specific assessment & plan notes found for this encounter.

## 2013-11-28 NOTE — Assessment & Plan Note (Signed)
BP good in clinic today.  Patient instructed to take Zebeta daily as prescribed.  Encouraged diet and exercise changes.  Patient to return in 6 months.

## 2013-12-14 ENCOUNTER — Telehealth: Payer: Self-pay | Admitting: Physician Assistant

## 2013-12-14 NOTE — Telephone Encounter (Signed)
Relevant patient education mailed to patient.  

## 2014-02-16 ENCOUNTER — Encounter: Payer: Self-pay | Admitting: Physician Assistant

## 2014-02-16 ENCOUNTER — Ambulatory Visit (INDEPENDENT_AMBULATORY_CARE_PROVIDER_SITE_OTHER): Payer: 59 | Admitting: Physician Assistant

## 2014-02-16 VITALS — BP 138/94 | HR 81 | Temp 98.2°F | Resp 18 | Ht 72.0 in | Wt 297.8 lb

## 2014-02-16 DIAGNOSIS — J019 Acute sinusitis, unspecified: Secondary | ICD-10-CM | POA: Insufficient documentation

## 2014-02-16 MED ORDER — HYDROCOD POLST-CHLORPHEN POLST 10-8 MG/5ML PO LQCR
5.0000 mL | Freq: Two times a day (BID) | ORAL | Status: DC | PRN
Start: 2014-02-16 — End: 2014-05-31

## 2014-02-16 MED ORDER — AMOXICILLIN-POT CLAVULANATE 875-125 MG PO TABS
1.0000 | ORAL_TABLET | Freq: Two times a day (BID) | ORAL | Status: DC
Start: 2014-02-16 — End: 2014-05-31

## 2014-02-16 NOTE — Progress Notes (Signed)
Pre visit review using our clinic review tool, if applicable. No additional management support is needed unless otherwise documented below in the visit note/SLS  

## 2014-02-16 NOTE — Progress Notes (Signed)
Patient presents to clinic today c/o 10 days of head congestion, sinus pressure, sinus pain, PND, and nonproductive cough. Patient also endorses fullness and scratchy throat.  Patient denies fever, chills, sweats or shortness of breath..  Patient has taken Sudafed with little relief of symptoms. Symptoms have worsened.  Past Medical History  Diagnosis Date  . Hypertension   . Obesity   . ADD (attention deficit disorder)     Current Outpatient Prescriptions on File Prior to Visit  Medication Sig Dispense Refill  . bisoprolol (ZEBETA) 10 MG tablet Take 1 tablet (10 mg total) by mouth daily.  90 tablet  1  . buPROPion (WELLBUTRIN XL) 150 MG 24 hr tablet Take 1 tablet (150 mg total) by mouth every morning.  90 tablet  3  . Loratadine (ALLERGY RELIEF REDI-TABS PO) Take 1 tablet by mouth daily as needed.        Marland Kitchen. omeprazole (PRILOSEC OTC) 20 MG tablet Take 20 mg by mouth daily.        . Probiotic Product (PROBIOTIC DAILY) CAPS Take by mouth daily.       No current facility-administered medications on file prior to visit.    Allergies  Allergen Reactions  . Tetracycline     REACTION: Rash/Swelling    Family History  Problem Relation Age of Onset  . Coronary artery disease      first degree relative    History   Social History  . Marital Status: Married    Spouse Name: N/A    Number of Children: N/A  . Years of Education: N/A   Social History Main Topics  . Smoking status: Never Smoker   . Smokeless tobacco: Never Used  . Alcohol Use: Yes  . Drug Use: No  . Sexual Activity: None   Other Topics Concern  . None   Social History Narrative  . None   Review of Systems - See HPI.  All other ROS are negative.  BP 138/94  Pulse 81  Temp(Src) 98.2 F (36.8 C) (Oral)  Resp 18  Ht 6' (1.829 m)  Wt 297 lb 12 oz (135.059 kg)  BMI 40.37 kg/m2  SpO2 98%  Physical Exam  Vitals reviewed. Constitutional: He is oriented to person, place, and time and well-developed,  well-nourished, and in no distress.  HENT:  Head: Normocephalic and atraumatic.  Right Ear: Tympanic membrane, external ear and ear canal normal.  Left Ear: Tympanic membrane, external ear and ear canal normal.  Nose: Right sinus exhibits maxillary sinus tenderness and frontal sinus tenderness. Left sinus exhibits maxillary sinus tenderness and frontal sinus tenderness.  Mouth/Throat: Uvula is midline, oropharynx is clear and moist and mucous membranes are normal. No oropharyngeal exudate, posterior oropharyngeal edema, posterior oropharyngeal erythema or tonsillar abscesses.  Eyes: Conjunctivae are normal. Pupils are equal, round, and reactive to light.  Neck: Neck supple.  Cardiovascular: Normal rate, regular rhythm and normal heart sounds.   Pulmonary/Chest: Effort normal and breath sounds normal. No respiratory distress. He has no wheezes. He has no rales. He exhibits no tenderness.  Lymphadenopathy:    He has no cervical adenopathy.  Neurological: He is alert and oriented to person, place, and time.  Skin: Skin is warm and dry. No rash noted.  Psychiatric: Affect normal.    Assessment/Plan: Acute sinusitis with symptoms > 10 days Rx Augmentin.  Increase fluids.  Flonase and saline nasal spray.  Plain Mucinex.  Tussionex for cough.  MTV.  STOP Sudafed.

## 2014-02-16 NOTE — Assessment & Plan Note (Signed)
Rx Augmentin.  Increase fluids.  Flonase and saline nasal spray.  Plain Mucinex.  Tussionex for cough.  MTV.  STOP Sudafed.

## 2014-02-16 NOTE — Patient Instructions (Signed)
Take Augmentin as directed with food.  Increase fluid intake.  Rest.  Avoid Sudafed.  May continue plain Mucinex.  Use Flonase and saline nasal spray daily.  Use Tussionex for cough.  Sinusitis Sinusitis is redness, soreness, and puffiness (inflammation) of the air pockets in the bones of your face (sinuses). The redness, soreness, and puffiness can cause air and mucus to get trapped in your sinuses. This can allow germs to grow and cause an infection.  HOME CARE   Drink enough fluids to keep your pee (urine) clear or pale yellow.  Use a humidifier in your home.  Run a hot shower to create steam in the bathroom. Sit in the bathroom with the door closed. Breathe in the steam 3 4 times a day.  Put a warm, moist washcloth on your face 3 4 times a day, or as told by your doctor.  Use salt water sprays (saline sprays) to wet the thick fluid in your nose. This can help the sinuses drain.  Only take medicine as told by your doctor. GET HELP RIGHT AWAY IF:   Your pain gets worse.  You have very bad headaches.  You are sick to your stomach (nauseous).  You throw up (vomit).  You are very sleepy (drowsy) all the time.  Your face is puffy (swollen).  Your vision changes.  You have a stiff neck.  You have trouble breathing. MAKE SURE YOU:   Understand these instructions.  Will watch your condition.  Will get help right away if you are not doing well or get worse. Document Released: 04/14/2008 Document Revised: 07/21/2012 Document Reviewed: 06/01/2012 [facility] Patient Information 2014 [place]ExitCare, MarylandLLC.

## 2014-05-09 ENCOUNTER — Other Ambulatory Visit: Payer: Self-pay | Admitting: Physician Assistant

## 2014-05-10 NOTE — Telephone Encounter (Signed)
Rx request to pharmacy/SLS  

## 2014-05-29 ENCOUNTER — Ambulatory Visit: Payer: 59 | Admitting: Physician Assistant

## 2014-05-31 ENCOUNTER — Encounter: Payer: Self-pay | Admitting: Physician Assistant

## 2014-05-31 ENCOUNTER — Ambulatory Visit (INDEPENDENT_AMBULATORY_CARE_PROVIDER_SITE_OTHER): Payer: 59 | Admitting: Physician Assistant

## 2014-05-31 VITALS — BP 120/88 | HR 78 | Temp 98.3°F | Resp 16 | Ht 72.0 in | Wt 296.2 lb

## 2014-05-31 DIAGNOSIS — I10 Essential (primary) hypertension: Secondary | ICD-10-CM

## 2014-05-31 NOTE — Progress Notes (Signed)
Patient presents to clinic today for follow-up of hypertension.  Patient endorses taking medication as directed.  Is trying to eat healthier.  Has stopped going to [employer] but states he is going to start swimming daily for exercise.  BP normotensive in clinic.   Past Medical History  Diagnosis Date  . Hypertension   . Obesity   . ADD (attention deficit disorder)     Current Outpatient Prescriptions on File Prior to Visit  Medication Sig Dispense Refill  . bisoprolol (ZEBETA) 10 MG tablet TAKE 1 TABLET BY MOUTH ONCE DAILY  90 tablet  1  . buPROPion (WELLBUTRIN XL) 150 MG 24 hr tablet Take 1 tablet (150 mg total) by mouth every morning.  90 tablet  3  . Loratadine (ALLERGY RELIEF REDI-TABS PO) Take 1 tablet by mouth daily as needed.        Marland Kitchen. omeprazole (PRILOSEC OTC) 20 MG tablet Take 20 mg by mouth daily.        . Probiotic Product (PROBIOTIC DAILY) CAPS Take by mouth daily.       No current facility-administered medications on file prior to visit.    Allergies  Allergen Reactions  . Tetracycline     REACTION: Rash/Swelling    Family History  Problem Relation Age of Onset  . Coronary artery disease      first degree relative    History   Social History  . Marital Status: Married    Spouse Name: N/A    Number of Children: N/A  . Years of Education: N/A   Social History Main Topics  . Smoking status: Never Smoker   . Smokeless tobacco: Never Used  . Alcohol Use: Yes  . Drug Use: No  . Sexual Activity: None   Other Topics Concern  . None   Social History Narrative  . None   Review of Systems - See HPI.  All other ROS are negative.  BP 120/88  Pulse 78  Temp(Src) 98.3 F (36.8 C) (Oral)  Resp 16  Ht 6' (1.829 m)  Wt 296 lb 4 oz (134.378 kg)  BMI 40.17 kg/m2  SpO2 96%  Physical Exam  Constitutional: He is oriented to person, place, and time and well-developed, well-nourished, and in no distress.  HENT:  Head: Normocephalic and atraumatic.  Eyes:  Conjunctivae are normal.  Neck: Neck supple.  Cardiovascular: Normal rate, regular rhythm, normal heart sounds and intact distal pulses.   Pulmonary/Chest: Effort normal and breath sounds normal. No respiratory distress. He has no wheezes. He has no rales. He exhibits no tenderness.  Neurological: He is alert and oriented to person, place, and time.  Skin: Skin is warm and dry. No rash noted.  Psychiatric: Affect normal.   Assessment/Plan: HYPERTENSION Well controlled.  Continue current regimen.  Follow-up in 6 months for CPE with fasting labs.

## 2014-05-31 NOTE — Progress Notes (Signed)
Pre visit review using our clinic review tool, if applicable. No additional management support is needed unless otherwise documented below in the visit note/SLS  

## 2014-05-31 NOTE — Patient Instructions (Signed)
Please continue medications as directed.  Increase your cardio exercise.  Goal is 150 minutes per week.  This will help promote weight loss and lower your BP.  Follow-up in 6 months for your annual physical.  Return sooner if you need anything.  Hypertension Hypertension, commonly called high blood pressure, is when the force of blood pumping through your arteries is too strong. Your arteries are the blood vessels that carry blood from your heart throughout your body. A blood pressure reading consists of a higher number over a lower number, such as 110/72. The higher number (systolic) is the pressure inside your arteries when your heart pumps. The lower number (diastolic) is the pressure inside your arteries when your heart relaxes. Ideally you want your blood pressure below 120/80. Hypertension forces your heart to work harder to pump blood. Your arteries may become narrow or stiff. Having hypertension puts you at risk for heart disease, stroke, and other problems.  RISK FACTORS Some risk factors for high blood pressure are controllable. Others are not.  Risk factors you cannot control include:   Race. You may be at higher risk if you are African American.  Age. Risk increases with age.  Gender. Men are at higher risk than women before age 76 years. After age 87, women are at higher risk than men. Risk factors you can control include:  Not getting enough exercise or physical activity.  Being overweight.  Getting too much fat, sugar, calories, or salt in your diet.  Drinking too much alcohol. SIGNS AND SYMPTOMS Hypertension does not usually cause signs or symptoms. Extremely high blood pressure (hypertensive crisis) may cause headache, anxiety, shortness of breath, and nosebleed. DIAGNOSIS  To check if you have hypertension, your health care provider will measure your blood pressure while you are seated, with your arm held at the level of your heart. It should be measured at least twice  using the same arm. Certain conditions can cause a difference in blood pressure between your right and left arms. A blood pressure reading that is higher than normal on one occasion does not mean that you need treatment. If one blood pressure reading is high, ask your health care provider about having it checked again. TREATMENT  Treating high blood pressure includes making lifestyle changes and possibly taking medication. Living a healthy lifestyle can help lower high blood pressure. You may need to change some of your habits. Lifestyle changes may include:  Following the DASH diet. This diet is high in fruits, vegetables, and whole grains. It is low in salt, red meat, and added sugars.  Getting at least 2 1/2 hours of brisk physical activity every week.  Losing weight if necessary.  Not smoking.  Limiting alcoholic beverages.  Learning ways to reduce stress. If lifestyle changes are not enough to get your blood pressure under control, your health care provider may prescribe medicine. You may need to take more than one. Work closely with your health care provider to understand the risks and benefits. HOME CARE INSTRUCTIONS  Have your blood pressure rechecked as directed by your health care provider.   Only take medicine as directed by your health care provider. Follow the directions carefully. Blood pressure medicines must be taken as prescribed. The medicine does not work as well when you skip doses. Skipping doses also puts you at risk for problems.   Do not smoke.   Monitor your blood pressure at home as directed by your health care provider. SEEK MEDICAL CARE IF:  You think you are having a reaction to medicines taken.  You have recurrent headaches or feel dizzy.  You have swelling in your ankles.  You have trouble with your vision. SEEK IMMEDIATE MEDICAL CARE IF:  You develop a severe headache or confusion.  You have unusual weakness, numbness, or feel faint.  You  have severe chest or abdominal pain.  You vomit repeatedly.  You have trouble breathing. MAKE SURE YOU:   Understand these instructions.  Will watch your condition.  Will get help right away if you are not doing well or get worse. Document Released: 10/27/2005 Document Revised: 11/01/2013 Document Reviewed: 08/19/2013 [facility]ExitCare Patient Information 2015 [place]ExitCare, [place]LLC. This information is not intended to replace advice given to you by your health care provider. Make sure you discuss any questions you have with your health care provider.

## 2014-05-31 NOTE — Assessment & Plan Note (Signed)
Well controlled.  Continue current regimen.  Follow-up in 6 months for CPE with fasting labs.

## 2014-10-30 ENCOUNTER — Telehealth: Payer: Self-pay | Admitting: Physician Assistant

## 2014-10-30 NOTE — Telephone Encounter (Signed)
Rx request to pharmacy/SLS Requested drug refills are authorized for 30-day supply per provider VO, however, the patient needs further evaluation and/or laboratory testing before further refills are given. Ask him to make an appointment for this.  Please call patient and schedule CPE with fasting labs for January per provider/sls Thanks.

## 2014-10-30 NOTE — Telephone Encounter (Signed)
Patient called back stating that he does not want to have a cpe in January. He is requesting to have his cpe mid year.

## 2014-10-30 NOTE — Telephone Encounter (Signed)
Follow up appointment already scheduled for January.

## 2014-10-31 NOTE — Telephone Encounter (Signed)
He can do physical when he likes but he is still due for follow-up and repeat blood work before additional refills will be given.

## 2014-10-31 NOTE — Telephone Encounter (Signed)
Please advise patient on provider instructions and schedule F/U appointment for January, prior to any future refills/SLS Thanks.

## 2014-11-01 NOTE — Telephone Encounter (Signed)
Informed patient that he needs to be fasting to 11/29/14 appointment

## 2014-11-29 ENCOUNTER — Ambulatory Visit: Payer: 59 | Admitting: Physician Assistant

## 2014-11-29 ENCOUNTER — Ambulatory Visit (INDEPENDENT_AMBULATORY_CARE_PROVIDER_SITE_OTHER): Payer: 59 | Admitting: Physician Assistant

## 2014-11-29 ENCOUNTER — Encounter: Payer: Self-pay | Admitting: Physician Assistant

## 2014-11-29 VITALS — BP 127/76 | HR 75 | Temp 98.7°F | Resp 16 | Ht 72.0 in | Wt 285.4 lb

## 2014-11-29 DIAGNOSIS — F900 Attention-deficit hyperactivity disorder, predominantly inattentive type: Secondary | ICD-10-CM

## 2014-11-29 DIAGNOSIS — I10 Essential (primary) hypertension: Secondary | ICD-10-CM

## 2014-11-29 LAB — BASIC METABOLIC PANEL
BUN: 17 mg/dL (ref 6–23)
CHLORIDE: 106 meq/L (ref 96–112)
CO2: 30 mEq/L (ref 19–32)
CREATININE: 1.2 mg/dL (ref 0.40–1.50)
Calcium: 9.3 mg/dL (ref 8.4–10.5)
GFR: 74.81 mL/min (ref >60.00)
Glucose, Bld: 98 mg/dL (ref 70–99)
Potassium: 3.9 mEq/L (ref 3.5–5.1)
Sodium: 140 mEq/L (ref 135–145)

## 2014-11-29 MED ORDER — BISOPROLOL FUMARATE 10 MG PO TABS: 10.0000 mg | ORAL_TABLET | Freq: Every day | ORAL | Status: DC

## 2014-11-29 MED ORDER — BUPROPION HCL ER (XL) 150 MG PO TB24: 150.0000 mg | ORAL_TABLET | Freq: Every morning | ORAL | Status: DC

## 2014-11-29 MED ORDER — OMEPRAZOLE MAGNESIUM 20 MG PO TBEC: 20.0000 mg | DELAYED_RELEASE_TABLET | Freq: Every day | ORAL | Status: DC

## 2014-11-29 NOTE — Assessment & Plan Note (Signed)
I think it is acceptable to wean off of the medication.  Will start with 5 mg Bisoprolol daily for 2 weeks.  Will continue to monitor BP at home with goal of < 140/90.  If stable, will stop Bisoprolol altogether and monitor BP.  Follow-up in 1 month.

## 2014-11-29 NOTE — Progress Notes (Signed)
Pre visit review using our clinic review tool, if applicable. No additional management support is needed unless otherwise documented below in the visit note/SLS  

## 2014-11-29 NOTE — Assessment & Plan Note (Signed)
Well controlled. Medication refilled.

## 2014-11-29 NOTE — Patient Instructions (Signed)
Please continue Prilosec and Wellbutrin as directed.  For the Bisoprolol -- start taking 5 mg (1/2 tablet) daily for 2 weeks.  Have your fiance check your BP a few times per week.  If staying < 140/90, stop the BP medication after 2 weeks.  Continue checking BP.  If still under 140/90, ok to stay off of medication.  If BP is rising, please call or come see me.  DASH Eating Plan DASH stands for "Dietary Approaches to Stop Hypertension." The DASH eating plan is a healthy eating plan that has been shown to reduce high blood pressure (hypertension). Additional health benefits may include reducing the risk of type 2 diabetes mellitus, heart disease, and stroke. The DASH eating plan may also help with weight loss. WHAT DO I NEED TO KNOW ABOUT THE DASH EATING PLAN? For the DASH eating plan, you will follow these general guidelines:  Choose foods with a percent daily value for sodium of less than 5% (as listed on the food label).  Use salt-free seasonings or herbs instead of table salt or sea salt.  Check with your health care provider or pharmacist before using salt substitutes.  Eat lower-sodium products, often labeled as "lower sodium" or "no salt added."  Eat fresh foods.  Eat more vegetables, fruits, and low-fat dairy products.  Choose whole grains. Look for the word "whole" as the first word in the ingredient list.  Choose fish and skinless chicken or [place] more often than red meat. Limit fish, poultry, and meat to 6 oz (170 g) each day.  Limit sweets, desserts, sugars, and sugary drinks.  Choose heart-healthy fats.  Limit cheese to 1 oz (28 g) per day.  Eat more home-cooked food and less restaurant, buffet, and fast food.  Limit fried foods.  Cook foods using methods other than frying.  Limit canned vegetables. If you do use them, rinse them well to decrease the sodium.  When eating at a restaurant, ask that your food be prepared with less salt, or no salt if possible. WHAT  FOODS CAN I EAT? Seek help from a dietitian for individual calorie needs. Grains Whole grain or whole wheat bread. Brown rice. Whole grain or whole wheat pasta. Quinoa, bulgur, and whole grain cereals. Low-sodium cereals. Corn or whole wheat flour tortillas. Whole grain cornbread. Whole grain crackers. Low-sodium crackers. Vegetables Fresh or frozen vegetables (raw, steamed, roasted, or grilled). Low-sodium or reduced-sodium tomato and vegetable juices. Low-sodium or reduced-sodium tomato sauce and paste. Low-sodium or reduced-sodium canned vegetables.  Fruits All fresh, canned (in natural juice), or frozen fruits. Meat and Other Protein Products Ground beef (85% or leaner), grass-fed beef, or beef trimmed of fat. Skinless chicken or [place]. Ground chicken or [place]. Pork trimmed of fat. All fish and seafood. Eggs. Dried beans, peas, or lentils. Unsalted nuts and seeds. Unsalted canned beans. Dairy Low-fat dairy products, such as skim or 1% milk, 2% or reduced-fat cheeses, low-fat ricotta or cottage cheese, or plain low-fat yogurt. Low-sodium or reduced-sodium cheeses. Fats and Oils Tub margarines without trans fats. Light or reduced-fat mayonnaise and salad dressings (reduced sodium). Avocado. Safflower, olive, or canola oils. Natural peanut or almond butter. Other Unsalted popcorn and pretzels. The items listed above may not be a complete list of recommended foods or beverages. Contact your dietitian for more options. WHAT FOODS ARE NOT RECOMMENDED? Grains White bread. White pasta. White rice. Refined cornbread. Bagels and croissants. Crackers that contain trans fat. Vegetables Creamed or fried vegetables. Vegetables in a cheese sauce. Regular  canned vegetables. Regular canned tomato sauce and paste. Regular tomato and vegetable juices. Fruits Dried fruits. Canned fruit in light or heavy syrup. Fruit juice. Meat and Other Protein Products Fatty cuts of meat. Ribs, chicken wings, bacon,  sausage, bologna, salami, chitterlings, fatback, hot dogs, bratwurst, and packaged luncheon meats. Salted nuts and seeds. Canned beans with salt. Dairy Whole or 2% milk, cream, half-and-half, and cream cheese. Whole-fat or sweetened yogurt. Full-fat cheeses or blue cheese. Nondairy creamers and whipped toppings. Processed cheese, cheese spreads, or cheese curds. Condiments Onion and garlic salt, seasoned salt, table salt, and sea salt. Canned and packaged gravies. Worcestershire sauce. Tartar sauce. Barbecue sauce. Teriyaki sauce. Soy sauce, including reduced sodium. Steak sauce. Fish sauce. Oyster sauce. Cocktail sauce. Horseradish. Ketchup and mustard. Meat flavorings and tenderizers. Bouillon cubes. Hot sauce. Tabasco sauce. Marinades. Taco seasonings. Relishes. Fats and Oils Butter, stick margarine, lard, shortening, ghee, and bacon fat. Coconut, palm kernel, or palm oils. Regular salad dressings. Other Pickles and olives. Salted popcorn and pretzels. The items listed above may not be a complete list of foods and beverages to avoid. Contact your dietitian for more information. WHERE CAN I FIND MORE INFORMATION? National Heart, Lung, and Blood Institute: [employer].itwww.nhlbi.nih.gov/health/health-topics/topics/dash/ Document Released: 10/16/2011 Document Revised: 03/13/2014 Document Reviewed: 08/31/2013 [facility] CarolinaExitCare Patient Information 2015 [place] CityExitCare, [place]LLC. This information is not intended to replace advice given to you by your health care provider. Make sure you discuss any questions you have with your health care provider.

## 2014-11-29 NOTE — Progress Notes (Signed)
   Patient presents to clinic today for follow-up of hypertension.  Patient is on Zebeta 10 mg daily.  Endorses doing well on medication.  BP well controlled today in clinic.  Patient denies chest pain, palpitations, lightheadedness, dizziness, vision changes or frequent headaches.  Has lost 20 pounds.  Would like to discuss coming off or weaning down on the medication.  Patient is taking his Wellbutrin as directed with good relief of ADHD.  Has been well-controlled for > 1 year on this medication.  Past Medical History  Diagnosis Date  . Hypertension   . Obesity   . ADD (attention deficit disorder)     Current Outpatient Prescriptions on File Prior to Visit  Medication Sig Dispense Refill  . Loratadine (ALLERGY RELIEF REDI-TABS PO) Take 1 tablet by mouth daily as needed.       No current facility-administered medications on file prior to visit.    Allergies  Allergen Reactions  . Tetracycline     REACTION: Rash/Swelling    Family History  Problem Relation Age of Onset  . Coronary artery disease      first degree relative    History   Social History  . Marital Status: Married    Spouse Name: N/A    Number of Children: N/A  . Years of Education: N/A   Social History Main Topics  . Smoking status: Never Smoker   . Smokeless tobacco: Never Used  . Alcohol Use: Yes  . Drug Use: No  . Sexual Activity: None   Other Topics Concern  . None   Social History Narrative   Review of Systems - See HPI.  All other ROS are negative.  BP 127/76 mmHg  Pulse 75  Temp(Src) 98.7 F (37.1 C) (Oral)  Resp 16  Ht 6' (1.829 m)  Wt 285 lb 6 oz (129.445 kg)  BMI 38.70 kg/m2  SpO2 100%  Physical Exam  Constitutional: He is oriented to person, place, and time and well-developed, well-nourished, and in no distress.  HENT:  Head: Normocephalic and atraumatic.  Eyes: Conjunctivae are normal.  Neck: Neck supple.  Cardiovascular: Normal rate, regular rhythm, normal heart sounds and  intact distal pulses.   Pulmonary/Chest: Effort normal and breath sounds normal. No respiratory distress. He has no wheezes. He has no rales. He exhibits no tenderness.  Neurological: He is alert and oriented to person, place, and time.  Skin: Skin is warm and dry. No rash noted.  Psychiatric: Affect normal.  Vitals reviewed.  Assessment/Plan: Essential hypertension I think it is acceptable to wean off of the medication.  Will start with 5 mg Bisoprolol daily for 2 weeks.  Will continue to monitor BP at home with goal of < 140/90.  If stable, will stop Bisoprolol altogether and monitor BP.  Follow-up in 1 month.   Attention deficit hyperactivity disorder (ADHD) Well-controlled.  Medication refilled.

## 2015-05-30 ENCOUNTER — Ambulatory Visit (INDEPENDENT_AMBULATORY_CARE_PROVIDER_SITE_OTHER): Payer: 59 | Admitting: Physician Assistant

## 2015-05-30 ENCOUNTER — Encounter: Payer: Self-pay | Admitting: Physician Assistant

## 2015-05-30 VITALS — BP 124/86 | HR 80 | Temp 98.1°F | Resp 14 | Ht 72.0 in | Wt 285.2 lb

## 2015-05-30 DIAGNOSIS — I10 Essential (primary) hypertension: Secondary | ICD-10-CM | POA: Diagnosis not present

## 2015-05-30 DIAGNOSIS — Z0000 Encounter for general adult medical examination without abnormal findings: Secondary | ICD-10-CM

## 2015-05-30 DIAGNOSIS — Z23 Encounter for immunization: Secondary | ICD-10-CM

## 2015-05-30 DIAGNOSIS — F900 Attention-deficit hyperactivity disorder, predominantly inattentive type: Secondary | ICD-10-CM

## 2015-05-30 LAB — CBC
HCT: 44 % (ref 39.0–52.0)
Hemoglobin: 14.9 g/dL (ref 13.0–17.0)
MCHC: 33.8 g/dL (ref 30.0–36.0)
MCV: 88.2 fl (ref 78.0–100.0)
Platelets: 227 10*3/uL (ref 150.0–400.0)
RBC: 4.99 Mil/uL (ref 4.22–5.81)
RDW: 12.8 % (ref 11.5–15.5)
WBC: 7.6 10*3/uL (ref 4.0–10.5)

## 2015-05-30 LAB — LIPID PANEL
Cholesterol: 185 mg/dL (ref 0–200)
HDL: 49 mg/dL (ref >39.00)
LDL CALC: 115 mg/dL — AB (ref 0–99)
NonHDL: 136
Total CHOL/HDL Ratio: 4
Triglycerides: 107 mg/dL (ref 0.0–149.0)
VLDL: 21.4 mg/dL (ref 0.0–40.0)

## 2015-05-30 LAB — HEPATIC FUNCTION PANEL
ALT: 23 U/L (ref 0–53)
AST: 22 U/L (ref 0–37)
Albumin: 4.3 g/dL (ref 3.5–5.2)
Alkaline Phosphatase: 56 U/L (ref 39–117)
Bilirubin, Direct: 0.2 mg/dL (ref 0.0–0.3)
TOTAL PROTEIN: 7.4 g/dL (ref 6.0–8.3)
Total Bilirubin: 0.9 mg/dL (ref 0.2–1.2)

## 2015-05-30 LAB — URINALYSIS, ROUTINE W REFLEX MICROSCOPIC
BILIRUBIN URINE: NEGATIVE
Hgb urine dipstick: NEGATIVE
Ketones, ur: NEGATIVE
LEUKOCYTES UA: NEGATIVE
NITRITE: NEGATIVE
PH: 6.5 (ref 5.0–8.0)
Specific Gravity, Urine: <=1.005 — AB (ref 1.000–1.030)
Total Protein, Urine: NEGATIVE
Urine Glucose: NEGATIVE
Urobilinogen, UA: 0.2 (ref 0.0–1.0)

## 2015-05-30 LAB — BASIC METABOLIC PANEL
BUN: 13 mg/dL (ref 6–23)
CO2: 29 mEq/L (ref 19–32)
CREATININE: 1.08 mg/dL (ref 0.40–1.50)
Calcium: 9.6 mg/dL (ref 8.4–10.5)
Chloride: 103 mEq/L (ref 96–112)
GFR: 84.22 mL/min (ref >60.00)
Glucose, Bld: 85 mg/dL (ref 70–99)
Potassium: 4 mEq/L (ref 3.5–5.1)
Sodium: 138 mEq/L (ref 135–145)

## 2015-05-30 LAB — HEMOGLOBIN A1C: HEMOGLOBIN A1C: 5.2 % (ref 4.6–6.5)

## 2015-05-30 MED ORDER — OMEPRAZOLE MAGNESIUM 20 MG PO TBEC: 20.0000 mg | DELAYED_RELEASE_TABLET | Freq: Every day | ORAL | Status: DC

## 2015-05-30 MED ORDER — BUPROPION HCL ER (XL) 150 MG PO TB24: 150.0000 mg | ORAL_TABLET | Freq: Every morning | ORAL | Status: DC

## 2015-05-30 NOTE — Progress Notes (Signed)
Patient presents to clinic today for annual exam.  Patient is fasting for labs.  Acute Concerns: Acute concerns at today's visit.  Chronic Issues: Patient previous history of hypertension, controlled with bisoprolol. Patient states he has been eating better and trying to stay active. Has weaned himself off the bisoprolol, and has been off of medication for greater than one month. Patient denies chest pain, palpitations, lightheadedness, dizziness, vision changes or frequent headaches.  BP Readings from Last 3 Encounters:  05/30/15 124/86  05/30/15 124/86  11/29/14 127/76   Patient also with history of ADD well controlled with Wellbutrin 150 mg daily. Denies side effects of medication.  Health Maintenance: Dental -- up-to-date Vision -- up-to-date Immunizations -- due for TDaP. Will give today.  Past Medical History  Diagnosis Date  . Hypertension   . Obesity   . ADD (attention deficit disorder)     No past surgical history on file.  Current Outpatient Prescriptions on File Prior to Visit  Medication Sig Dispense Refill  . buPROPion (WELLBUTRIN XL) 150 MG 24 hr tablet Take 1 tablet (150 mg total) by mouth every morning. 90 tablet 3  . Loratadine (ALLERGY RELIEF REDI-TABS PO) Take 1 tablet by mouth daily as needed.      Marland Kitchen. omeprazole (PRILOSEC OTC) 20 MG tablet Take 1 tablet (20 mg total) by mouth daily. 90 tablet 1   No current facility-administered medications on file prior to visit.    Allergies  Allergen Reactions  . Tetracycline     REACTION: Rash/Swelling    Family History  Problem Relation Age of Onset  . Coronary artery disease      first degree relative    History   Social History  . Marital Status: Married    Spouse Name: N/A  . Number of Children: N/A  . Years of Education: N/A   Occupational History  . Not on file.   Social History Main Topics  . Smoking status: Never Smoker   . Smokeless tobacco: Never Used  . Alcohol Use: Yes  . Drug  Use: No  . Sexual Activity: Not on file   Other Topics Concern  . Not on file   Social History Narrative    Review of Systems  Constitutional: Negative for fever and weight loss.  HENT: Negative for ear discharge, ear pain, hearing loss and tinnitus.   Eyes: Negative for blurred vision, double vision, photophobia and pain.  Respiratory: Negative for cough and shortness of breath.   Cardiovascular: Negative for chest pain and palpitations.  Gastrointestinal: Negative for heartburn, nausea, vomiting, abdominal pain, diarrhea, constipation, blood in stool and melena.  Genitourinary: Negative for dysuria, urgency, frequency, hematuria and flank pain.  Musculoskeletal: Negative for falls.  Neurological: Negative for dizziness, loss of consciousness and headaches.  Endo/Heme/Allergies: Negative for environmental allergies.  Psychiatric/Behavioral: Negative for depression, suicidal ideas, hallucinations and substance abuse. The patient is not nervous/anxious and does not have insomnia.    BP 124/86 mmHg  Pulse 80  Temp(Src) 98.1 F (36.7 C) (Oral)  Resp 14  Ht 6' (1.829 m)  Wt 285 lb 3.2 oz (129.366 kg)  BMI 38.67 kg/m2  SpO2 96%  Physical Exam  Constitutional: He is oriented to person, place, and time and well-developed, well-nourished, and in no distress.  HENT:  Head: Normocephalic and atraumatic.  Right Ear: External ear normal.  Left Ear: External ear normal.  Nose: Nose normal.  Mouth/Throat: Oropharynx is clear and moist. No oropharyngeal exudate.  Eyes: Conjunctivae and  EOM are normal. Pupils are equal, round, and reactive to light.  Neck: Neck supple. No thyromegaly present.  Cardiovascular: Normal rate, regular rhythm, normal heart sounds and intact distal pulses.   Pulmonary/Chest: Effort normal and breath sounds normal. No respiratory distress. He has no wheezes. He has no rales. He exhibits no tenderness.  Abdominal: Soft. Bowel sounds are normal. He exhibits no  distension and no mass. There is no tenderness. There is no rebound and no guarding.  Genitourinary: Testes/scrotum normal and penis normal. No discharge found.  Lymphadenopathy:    He has no cervical adenopathy.  Neurological: He is alert and oriented to person, place, and time.  Skin: Skin is warm and dry. No rash noted.  Psychiatric: Affect normal.  Vitals reviewed.   No results found for this or any previous visit (from the past 2160 hour(s)).  Assessment/Plan: Annual physical exam Depression screening negative. Health maintenance reviewed. Tetanus due. Given today by nursing staff. Preventative health schedule discussed with patient. Handout given in the AVS. Will obtain fasting labs today to include nicotine level.  Need for diphtheria-tetanus-pertussis (Tdap) vaccine, adult/adolescent TdaP given by nursing staff today.  Essential hypertension Blood pressure normotensive off of the bisoprolol. We'll continue therapeutic lifestyle changes. Will routinely measure his blood pressure at office visits.  Attention deficit hyperactivity disorder (ADHD) Well-controlled with Wellbutrin XL. Continue same. Medications refilled. Follow-up in 6 months.

## 2015-05-30 NOTE — Progress Notes (Deleted)
   Patient presents to clinic today c/o ***.   Past Medical History  Diagnosis Date  . Hypertension   . Obesity   . ADD (attention deficit disorder)     Current Outpatient Prescriptions on File Prior to Visit  Medication Sig Dispense Refill  . buPROPion (WELLBUTRIN XL) 150 MG 24 hr tablet Take 1 tablet (150 mg total) by mouth every morning. 90 tablet 2  . Loratadine (ALLERGY RELIEF REDI-TABS PO) Take 1 tablet by mouth daily as needed.      Marland Kitchen. omeprazole (PRILOSEC OTC) 20 MG tablet Take 1 tablet (20 mg total) by mouth daily. 90 tablet 1  . bisoprolol (ZEBETA) 10 MG tablet Take 1 tablet (10 mg total) by mouth daily. (Patient not taking: Reported on 05/30/2015) 30 tablet 1   No current facility-administered medications on file prior to visit.    Allergies  Allergen Reactions  . Tetracycline     REACTION: Rash/Swelling    Family History  Problem Relation Age of Onset  . Coronary artery disease      first degree relative    History   Social History  . Marital Status: Married    Spouse Name: N/A  . Number of Children: N/A  . Years of Education: N/A   Social History Main Topics  . Smoking status: Never Smoker   . Smokeless tobacco: Never Used  . Alcohol Use: Yes  . Drug Use: No  . Sexual Activity: Not on file   Other Topics Concern  . None   Social History Narrative   Review of Systems - See HPI.  All other ROS are negative.  BP 124/86 mmHg  Pulse 80  Temp(Src) 98.1 F (36.7 C) (Oral)  Ht 6' (1.829 m)  Wt 285 lb 2 oz (129.332 kg)  BMI 38.66 kg/m2  SpO2 95%  Physical Exam  No results found for this or any previous visit (from the past 2160 hour(s)).  Assessment/Plan: No problem-specific assessment & plan notes found for this encounter.

## 2015-05-30 NOTE — Assessment & Plan Note (Signed)
Depression screening negative. Health maintenance reviewed. Tetanus due. Given today by nursing staff. Preventative health schedule discussed with patient. Handout given in the AVS. Will obtain fasting labs today to include nicotine level.

## 2015-05-30 NOTE — Patient Instructions (Addendum)
Please go to the lab for blood work. I will call you with your results.  Continue Wellbutrin as directed. Follow-up 1 year.  Preventive Care for Adults A healthy lifestyle and preventive care can promote health and wellness. Preventive health guidelines for men include the following key practices:  A routine yearly physical is a good way to check with your health care provider about your health and preventative screening. It is a chance to share any concerns and updates on your health and to receive a thorough exam.  Visit your dentist for a routine exam and preventative care every 6 months. Brush your teeth twice a day and floss once a day. Good oral hygiene prevents tooth decay and gum disease.  The frequency of eye exams is based on your age, health, family medical history, use of contact lenses, and other factors. Follow your health care provider's recommendations for frequency of eye exams.  Eat a healthy diet. Foods such as vegetables, fruits, whole grains, low-fat dairy products, and lean protein foods contain the nutrients you need without too many calories. Decrease your intake of foods high in solid fats, added sugars, and salt. Eat the right amount of calories for you.Get information about a proper diet from your health care provider, if necessary.  Regular physical exercise is one of the most important things you can do for your health. Most adults should get at least 150 minutes of moderate-intensity exercise (any activity that increases your heart rate and causes you to sweat) each week. In addition, most adults need muscle-strengthening exercises on 2 or more days a week.  Maintain a healthy weight. The body mass index (BMI) is a screening tool to identify possible weight problems. It provides an estimate of body fat based on height and weight. Your health care provider can find your BMI and can help you achieve or maintain a healthy weight.For adults 20 years and older:  A BMI  below 18.5 is considered underweight.  A BMI of 18.5 to 24.9 is normal.  A BMI of 25 to 29.9 is considered overweight.  A BMI of 30 and above is considered obese.  Maintain normal blood lipids and cholesterol levels by exercising and minimizing your intake of saturated fat. Eat a balanced diet with plenty of fruit and vegetables. Blood tests for lipids and cholesterol should begin at age 20 and be repeated every 5 years. If your lipid or cholesterol levels are high, you are over 50, or you are at high risk for heart disease, you may need your cholesterol levels checked more frequently.Ongoing high lipid and cholesterol levels should be treated with medicines if diet and exercise are not working.  If you smoke, find out from your health care provider how to quit. If you do not use tobacco, do not start.  Lung cancer screening is recommended for adults aged 55-80 years who are at high risk for developing lung cancer because of a history of smoking. A yearly low-dose CT scan of the lungs is recommended for people who have at least a 30-pack-year history of smoking and are a current smoker or have quit within the past 15 years. A pack year of smoking is smoking an average of 1 pack of cigarettes a day for 1 year (for example: 1 pack a day for 30 years or 2 packs a day for 15 years). Yearly screening should continue until the smoker has stopped smoking for at least 15 years. Yearly screening should be stopped for people who develop   a health problem that would prevent them from having lung cancer treatment.  If you choose to drink alcohol, do not have more than 2 drinks per day. One drink is considered to be 12 ounces (355 mL) of beer, 5 ounces (148 mL) of wine, or 1.5 ounces (44 mL) of liquor.  Avoid use of street drugs. Do not share needles with anyone. Ask for help if you need support or instructions about stopping the use of drugs.  High blood pressure causes heart disease and increases the risk of  stroke. Your blood pressure should be checked at least every 1-2 years. Ongoing high blood pressure should be treated with medicines, if weight loss and exercise are not effective.  If you are 43-56 years old, ask your health care provider if you should take aspirin to prevent heart disease.  Diabetes screening involves taking a blood sample to check your fasting blood sugar level. This should be done once every 3 years, after age 18, if you are within normal weight and without risk factors for diabetes. Testing should be considered at a younger age or be carried out more frequently if you are overweight and have at least 1 risk factor for diabetes.  Colorectal cancer can be detected and often prevented. Most routine colorectal cancer screening begins at the age of 33 and continues through age 26. However, your health care provider may recommend screening at an earlier age if you have risk factors for colon cancer. On a yearly basis, your health care provider may provide home test kits to check for hidden blood in the stool. Use of a small camera at the end of a tube to directly examine the colon (sigmoidoscopy or colonoscopy) can detect the earliest forms of colorectal cancer. Talk to your health care provider about this at age 61, when routine screening begins. Direct exam of the colon should be repeated every 5-10 years through age 36, unless early forms of precancerous polyps or small growths are found.  People who are at an increased risk for hepatitis B should be screened for this virus. You are considered at high risk for hepatitis B if:  You were born in a country where hepatitis B occurs often. Talk with your health care provider about which countries are considered high risk.  Your parents were born in a high-risk country and you have not received a shot to protect against hepatitis B (hepatitis B vaccine).  You have HIV or AIDS.  You use needles to inject street drugs.  You live with, or  have sex with, someone who has hepatitis B.  You are a man who has sex with other men (MSM).  You get hemodialysis treatment.  You take certain medicines for conditions such as cancer, organ transplantation, and autoimmune conditions.  Hepatitis C blood testing is recommended for all people born from [birth year] through 1965 and any individual with known risks for hepatitis C.  Practice safe sex. Use condoms and avoid high-risk sexual practices to reduce the spread of sexually transmitted infections (STIs). STIs include gonorrhea, chlamydia, syphilis, trichomonas, herpes, HPV, and human immunodeficiency virus (HIV). Herpes, HIV, and HPV are viral illnesses that have no cure. They can result in disability, cancer, and death.  If you are at risk of being infected with HIV, it is recommended that you take a prescription medicine daily to prevent HIV infection. This is called preexposure prophylaxis (PrEP). You are considered at risk if:  You are a man who has sex with other  men (MSM) and have other risk factors.  You are a heterosexual man, are sexually active, and are at increased risk for HIV infection.  You take drugs by injection.  You are sexually active with a partner who has HIV.  Talk with your health care provider about whether you are at high risk of being infected with HIV. If you choose to begin PrEP, you should first be tested for HIV. You should then be tested every 3 months for as long as you are taking PrEP.  A one-time screening for abdominal aortic aneurysm (AAA) and surgical repair of large AAAs by ultrasound are recommended for men ages 65 to 75 years who are current or former smokers.  Healthy men should no longer receive prostate-specific antigen (PSA) blood tests as part of routine cancer screening. Talk with your health care provider about prostate cancer screening.  Testicular cancer screening is not recommended for adult males who have no symptoms. Screening includes  self-exam, a health care provider exam, and other screening tests. Consult with your health care provider about any symptoms you have or any concerns you have about testicular cancer.  Use sunscreen. Apply sunscreen liberally and repeatedly throughout the day. You should seek shade when your shadow is shorter than you. Protect yourself by wearing long sleeves, pants, a wide-brimmed hat, and sunglasses year round, whenever you are outdoors.  Once a month, do a whole-body skin exam, using a mirror to look at the skin on your back. Tell your health care provider about new moles, moles that have irregular borders, moles that are larger than a pencil eraser, or moles that have changed in shape or color.  Stay current with required vaccines (immunizations).  Influenza vaccine. All adults should be immunized every year.  Tetanus, diphtheria, and acellular pertussis (Td, Tdap) vaccine. An adult who has not previously received Tdap or who does not know his vaccine status should receive 1 dose of Tdap. This initial dose should be followed by tetanus and diphtheria toxoids (Td) booster doses every 10 years. Adults with an unknown or incomplete history of completing a 3-dose immunization series with Td-containing vaccines should begin or complete a primary immunization series including a Tdap dose. Adults should receive a Td booster every 10 years.  Varicella vaccine. An adult without evidence of immunity to varicella should receive 2 doses or a second dose if he has previously received 1 dose.  Human papillomavirus (HPV) vaccine. Males aged 13-21 years who have not received the vaccine previously should receive the 3-dose series. Males aged 22-26 years may be immunized. Immunization is recommended through the age of 26 years for any male who has sex with males and did not get any or all doses earlier. Immunization is recommended for any person with an immunocompromised condition through the age of 26 years if he  did not get any or all doses earlier. During the 3-dose series, the second dose should be obtained 4-8 weeks after the first dose. The third dose should be obtained 24 weeks after the first dose and 16 weeks after the second dose.  Zoster vaccine. One dose is recommended for adults aged 60 years or older unless certain conditions are present.  Measles, mumps, and rubella (MMR) vaccine. Adults born before 1957 generally are considered immune to measles and mumps. Adults born in 1957 or later should have 1 or more doses of MMR vaccine unless there is a contraindication to the vaccine or there is laboratory evidence of immunity to each of   the three diseases. A routine second dose of MMR vaccine should be obtained at least 28 days after the first dose for students attending postsecondary schools, health care workers, or international travelers. People who received inactivated measles vaccine or an unknown type of measles vaccine during 1963-1967 should receive 2 doses of MMR vaccine. People who received inactivated mumps vaccine or an unknown type of mumps vaccine before 1979 and are at high risk for mumps infection should consider immunization with 2 doses of MMR vaccine. Unvaccinated health care workers born before 1957 who lack laboratory evidence of measles, mumps, or rubella immunity or laboratory confirmation of disease should consider measles and mumps immunization with 2 doses of MMR vaccine or rubella immunization with 1 dose of MMR vaccine.  Pneumococcal 13-valent conjugate (PCV13) vaccine. When indicated, a person who is uncertain of his immunization history and has no record of immunization should receive the PCV13 vaccine. An adult aged 19 years or older who has certain medical conditions and has not been previously immunized should receive 1 dose of PCV13 vaccine. This PCV13 should be followed with a dose of pneumococcal polysaccharide (PPSV23) vaccine. The PPSV23 vaccine dose should be obtained at  least 8 weeks after the dose of PCV13 vaccine. An adult aged 19 years or older who has certain medical conditions and previously received 1 or more doses of PPSV23 vaccine should receive 1 dose of PCV13. The PCV13 vaccine dose should be obtained 1 or more years after the last PPSV23 vaccine dose.  Pneumococcal polysaccharide (PPSV23) vaccine. When PCV13 is also indicated, PCV13 should be obtained first. All adults aged 65 years and older should be immunized. An adult younger than age 65 years who has certain medical conditions should be immunized. Any person who resides in a nursing home or long-term care facility should be immunized. An adult smoker should be immunized. People with an immunocompromised condition and certain other conditions should receive both PCV13 and PPSV23 vaccines. People with human immunodeficiency virus (HIV) infection should be immunized as soon as possible after diagnosis. Immunization during chemotherapy or radiation therapy should be avoided. Routine use of PPSV23 vaccine is not recommended for American Indians, [place] Natives, or people younger than 65 years unless there are medical conditions that require PPSV23 vaccine. When indicated, people who have unknown immunization and have no record of immunization should receive PPSV23 vaccine. One-time revaccination 5 years after the first dose of PPSV23 is recommended for people aged 19-64 years who have chronic kidney failure, nephrotic syndrome, asplenia, or immunocompromised conditions. People who received 1-2 doses of PPSV23 before age 65 years should receive another dose of PPSV23 vaccine at age 65 years or later if at least 5 years have passed since the previous dose. Doses of PPSV23 are not needed for people immunized with PPSV23 at or after age 65 years.  Meningococcal vaccine. Adults with asplenia or persistent complement component deficiencies should receive 2 doses of quadrivalent meningococcal conjugate (MenACWY-D) vaccine.  The doses should be obtained at least 2 months apart. Microbiologists working with certain meningococcal bacteria, military recruits, people at risk during an outbreak, and people who travel to or live in countries with a high rate of meningitis should be immunized. A first-year college student up through age 21 years who is living in a residence hall should receive a dose if he did not receive a dose on or after his 16th birthday. Adults who have certain high-risk conditions should receive one or more doses of vaccine.  Hepatitis A vaccine.   Adults who wish to be protected from this disease, have certain high-risk conditions, work with hepatitis A-infected animals, work in hepatitis A research labs, or travel to or work in countries with a high rate of hepatitis A should be immunized. Adults who were previously unvaccinated and who anticipate close contact with an international adoptee during the first 60 days after arrival in the [place] from a country with a high rate of hepatitis A should be immunized.  Hepatitis B vaccine. Adults should be immunized if they wish to be protected from this disease, have certain high-risk conditions, may be exposed to blood or other infectious body fluids, are household contacts or sex partners of hepatitis B positive people, are clients or workers in certain care facilities, or travel to or work in countries with a high rate of hepatitis B.  Haemophilus influenzae type b (Hib) vaccine. A previously unvaccinated person with asplenia or sickle cell disease or having a scheduled splenectomy should receive 1 dose of Hib vaccine. Regardless of previous immunization, a recipient of a hematopoietic stem cell transplant should receive a 3-dose series 6-12 months after his successful transplant. Hib vaccine is not recommended for adults with HIV infection. Preventive Service / Frequency Ages 19 to 39  Blood pressure check.** / Every 1 to 2 years.  Lipid and cholesterol  check.** / Every 5 years beginning at age 20.  Hepatitis C blood test.** / For any individual with known risks for hepatitis C.  Skin self-exam. / Monthly.  Influenza vaccine. / Every year.  Tetanus, diphtheria, and acellular pertussis (Tdap, Td) vaccine.** / Consult your health care provider. 1 dose of Td every 10 years.  Varicella vaccine.** / Consult your health care provider.  HPV vaccine. / 3 doses over 6 months, if 26 or younger.  Measles, mumps, rubella (MMR) vaccine.** / You need at least 1 dose of MMR if you were born in 1957 or later. You may also need a second dose.  Pneumococcal 13-valent conjugate (PCV13) vaccine.** / Consult your health care provider.  Pneumococcal polysaccharide (PPSV23) vaccine.** / 1 to 2 doses if you smoke cigarettes or if you have certain conditions.  Meningococcal vaccine.** / 1 dose if you are age 19 to 21 years and a first-year college student living in a residence hall, or have one of several medical conditions. You may also need additional booster doses.  Hepatitis A vaccine.** / Consult your health care provider.  Hepatitis B vaccine.** / Consult your health care provider.  Haemophilus influenzae type b (Hib) vaccine.** / Consult your health care provider. Ages 40 to 64  Blood pressure check.** / Every 1 to 2 years.  Lipid and cholesterol check.** / Every 5 years beginning at age 20.  Lung cancer screening. / Every year if you are aged 55-80 years and have a 30-pack-year history of smoking and currently smoke or have quit within the past 15 years. Yearly screening is stopped once you have quit smoking for at least 15 years or develop a health problem that would prevent you from having lung cancer treatment.  Fecal occult blood test (FOBT) of stool. / Every year beginning at age 50 and continuing until age 75. You may not have to do this test if you get a colonoscopy every 10 years.  Flexible sigmoidoscopy** or colonoscopy.** / Every 5  years for a flexible sigmoidoscopy or every 10 years for a colonoscopy beginning at age 50 and continuing until age 75.  Hepatitis C blood test.** / For all   people born from [birth year] through 1963 and any individual with known risks for hepatitis C.  Skin self-exam. / Monthly.  Influenza vaccine. / Every year.  Tetanus, diphtheria, and acellular pertussis (Tdap/Td) vaccine.** / Consult your health care provider. 1 dose of Td every 10 years.  Varicella vaccine.** / Consult your health care provider.  Zoster vaccine.** / 1 dose for adults aged 7 years or older.  Measles, mumps, rubella (MMR) vaccine.** / You need at least 1 dose of MMR if you were born in 1957 or later. You may also need a second dose.  Pneumococcal 13-valent conjugate (PCV13) vaccine.** / Consult your health care provider.  Pneumococcal polysaccharide (PPSV23) vaccine.** / 1 to 2 doses if you smoke cigarettes or if you have certain conditions.  Meningococcal vaccine.** / Consult your health care provider.  Hepatitis A vaccine.** / Consult your health care provider.  Hepatitis B vaccine.** / Consult your health care provider.  Haemophilus influenzae type b (Hib) vaccine.** / Consult your health care provider. Ages 49 and over  Blood pressure check.** / Every 1 to 2 years.  Lipid and cholesterol check.**/ Every 5 years beginning at age 57.  Lung cancer screening. / Every year if you are aged 73-80 years and have a 30-pack-year history of smoking and currently smoke or have quit within the past 15 years. Yearly screening is stopped once you have quit smoking for at least 15 years or develop a health problem that would prevent you from having lung cancer treatment.  Fecal occult blood test (FOBT) of stool. / Every year beginning at age 67 and continuing until age 69. You may not have to do this test if you get a colonoscopy every 10 years.  Flexible sigmoidoscopy** or colonoscopy.** / Every 5 years for a flexible  sigmoidoscopy or every 10 years for a colonoscopy beginning at age 106 and continuing until age 72.  Hepatitis C blood test.** / For all people born from [birth year] through 1963 and any individual with known risks for hepatitis C.  Abdominal aortic aneurysm (AAA) screening.** / A one-time screening for ages 60 to 42 years who are current or former smokers.  Skin self-exam. / Monthly.  Influenza vaccine. / Every year.  Tetanus, diphtheria, and acellular pertussis (Tdap/Td) vaccine.** / 1 dose of Td every 10 years.  Varicella vaccine.** / Consult your health care provider.  Zoster vaccine.** / 1 dose for adults aged 74 years or older.  Pneumococcal 13-valent conjugate (PCV13) vaccine.** / Consult your health care provider.  Pneumococcal polysaccharide (PPSV23) vaccine.** / 1 dose for all adults aged 12 years and older.  Meningococcal vaccine.** / Consult your health care provider.  Hepatitis A vaccine.** / Consult your health care provider.  Hepatitis B vaccine.** / Consult your health care provider.  Haemophilus influenzae type b (Hib) vaccine.** / Consult your health care provider. **Family history and personal history of risk and conditions may change your health care provider's recommendations. Document Released: 12/23/2001 Document Revised: 11/01/2013 Document Reviewed: 03/24/2011 [facility] Patient Information 2015 [place], [place]. This information is not intended to replace advice given to you by your health care provider. Make sure you discuss any questions you have with your health care provider. Prev

## 2015-05-30 NOTE — Assessment & Plan Note (Addendum)
Blood pressure normotensive off of the bisoprolol. We'll continue therapeutic lifestyle changes. Will routinely measure his blood pressure at office visits.

## 2015-05-30 NOTE — Assessment & Plan Note (Signed)
Well-controlled with Wellbutrin XL. Continue same. Medications refilled. Follow-up in 6 months.

## 2015-05-30 NOTE — Progress Notes (Signed)
Pre visit review using our clinic review tool, if applicable. No additional management support is needed unless otherwise documented below in the visit note. 

## 2015-05-30 NOTE — Assessment & Plan Note (Signed)
TdaP given by nursing staff today.

## 2015-05-30 NOTE — Progress Notes (Signed)
Duplicate encounter.  Disregard. 

## 2015-06-01 LAB — NICOTINE/COTININE METABOLITES: Cotinine: <10 ng/mL

## 2016-06-06 ENCOUNTER — Ambulatory Visit (INDEPENDENT_AMBULATORY_CARE_PROVIDER_SITE_OTHER): Payer: 59 | Admitting: Physician Assistant

## 2016-06-06 ENCOUNTER — Encounter: Payer: Self-pay | Admitting: Physician Assistant

## 2016-06-06 VITALS — BP 126/78 | HR 80 | Temp 97.5°F | Resp 16 | Ht 72.0 in | Wt 281.2 lb

## 2016-06-06 DIAGNOSIS — F900 Attention-deficit hyperactivity disorder, predominantly inattentive type: Secondary | ICD-10-CM

## 2016-06-06 DIAGNOSIS — Z0001 Encounter for general adult medical examination with abnormal findings: Secondary | ICD-10-CM

## 2016-06-06 DIAGNOSIS — M545 Low back pain, unspecified: Secondary | ICD-10-CM

## 2016-06-06 DIAGNOSIS — Z0000 Encounter for general adult medical examination without abnormal findings: Secondary | ICD-10-CM

## 2016-06-06 DIAGNOSIS — I10 Essential (primary) hypertension: Secondary | ICD-10-CM | POA: Diagnosis not present

## 2016-06-06 LAB — LIPID PANEL
CHOL/HDL RATIO: 4
Cholesterol: 185 mg/dL (ref 0–200)
HDL: 41.7 mg/dL (ref >39.00)
LDL CALC: 121 mg/dL — AB (ref 0–99)
NONHDL: 143.77
Triglycerides: 112 mg/dL (ref 0.0–149.0)
VLDL: 22.4 mg/dL (ref 0.0–40.0)

## 2016-06-06 LAB — CBC
HCT: 42 % (ref 39.0–52.0)
HEMOGLOBIN: 14.4 g/dL (ref 13.0–17.0)
MCHC: 34.4 g/dL (ref 30.0–36.0)
MCV: 86.7 fl (ref 78.0–100.0)
PLATELETS: 233 10*3/uL (ref 150.0–400.0)
RBC: 4.84 Mil/uL (ref 4.22–5.81)
RDW: 12.7 % (ref 11.5–15.5)
WBC: 8 10*3/uL (ref 4.0–10.5)

## 2016-06-06 LAB — COMPREHENSIVE METABOLIC PANEL
ALK PHOS: 59 U/L (ref 39–117)
ALT: 27 U/L (ref 0–53)
AST: 24 U/L (ref 0–37)
Albumin: 4.6 g/dL (ref 3.5–5.2)
BILIRUBIN TOTAL: 0.9 mg/dL (ref 0.2–1.2)
BUN: 13 mg/dL (ref 6–23)
CO2: 32 meq/L (ref 19–32)
CREATININE: 1.16 mg/dL (ref 0.40–1.50)
Calcium: 9.7 mg/dL (ref 8.4–10.5)
Chloride: 104 mEq/L (ref 96–112)
GFR: 77.06 mL/min (ref >60.00)
GLUCOSE: 91 mg/dL (ref 70–99)
Potassium: 4.3 mEq/L (ref 3.5–5.1)
Sodium: 139 mEq/L (ref 135–145)
TOTAL PROTEIN: 7.7 g/dL (ref 6.0–8.3)

## 2016-06-06 LAB — URINALYSIS, ROUTINE W REFLEX MICROSCOPIC
Bilirubin Urine: NEGATIVE
Hgb urine dipstick: NEGATIVE
Ketones, ur: NEGATIVE
Leukocytes, UA: NEGATIVE
Nitrite: NEGATIVE
PH: 7 (ref 5.0–8.0)
RBC / HPF: NONE SEEN (ref 0–?)
SPECIFIC GRAVITY, URINE: 1.01 (ref 1.000–1.030)
TOTAL PROTEIN, URINE-UPE24: NEGATIVE
URINE GLUCOSE: NEGATIVE
UROBILINOGEN UA: 0.2 (ref 0.0–1.0)
WBC UA: NONE SEEN (ref 0–?)

## 2016-06-06 LAB — HEMOGLOBIN A1C: HEMOGLOBIN A1C: 5.3 % (ref 4.6–6.5)

## 2016-06-06 LAB — TSH: TSH: 1.1 u[IU]/mL (ref 0.35–4.50)

## 2016-06-06 MED ORDER — BUPROPION HCL ER (XL) 150 MG PO TB24: 150.0000 mg | ORAL_TABLET | Freq: Every morning | ORAL | 1 refills | Status: DC

## 2016-06-06 MED ORDER — OMEPRAZOLE 20 MG PO TBEC: 20.0000 mg | DELAYED_RELEASE_TABLET | Freq: Every day | ORAL | 1 refills | Status: DC

## 2016-06-06 NOTE — Progress Notes (Signed)
Patient presents to clinic today for annual exam.  Patient is fasting for labs.  Acute Concerns: Patient complains of know of left lower back with muscle tightness, intermittently over the past couple of months. Does not occasional muscular tenderness and pain after prolonged episodes of heavy lifting. Denies trauma or injury. Denies radiation of pain. Has not taken anything for symptoms.  Chronic Issues: Hypertension -- Patient has been able to control with diet and exercise previously. Patient denies chest pain, palpitations, lightheadedness, dizziness, vision changes or frequent headaches.  BP Readings from Last 3 Encounters:  06/06/16 126/78  05/30/15 124/86  05/30/15 124/86   ADHD -- Is taking Wellbutrin as directed. Continues to do very well with this regimen. Endorses good work International aid/development worker. Denies change in mood.   Health Maintenance: Immunizations  -- up-to-date  Past Medical History:  Diagnosis Date  . ADD (attention deficit disorder)   . Hypertension   . Obesity     Past Surgical History:  Procedure Laterality Date  . ESOPHAGOGASTRODUODENOSCOPY ENDOSCOPY N/A 2009    Current Outpatient Prescriptions on File Prior to Visit  Medication Sig Dispense Refill  . Loratadine (ALLERGY RELIEF REDI-TABS PO) Take 1 tablet by mouth daily as needed.       No current facility-administered medications on file prior to visit.     Allergies  Allergen Reactions  . Tetracycline     REACTION: Rash/Swelling    Family History  Problem Relation Age of Onset  . Coronary artery disease      first degree relative  . Coronary artery disease Father   . Stroke Paternal Grandmother   . Coronary artery disease Paternal Grandfather   . Heart attack Paternal Grandfather     Social History   Social History  . Marital status: Married    Spouse name: N/A  . Number of children: N/A  . Years of education: N/A   Occupational History  . Not on file.   Social History Main Topics    . Smoking status: Never Smoker  . Smokeless tobacco: Never Used  . Alcohol use 0.6 oz/week    1 Cans of beer per week     Comment: once a week   . Drug use: No  . Sexual activity: Not on file   Other Topics Concern  . Not on file   Social History Narrative  . No narrative on file    Review of Systems  Constitutional: Negative for fever and weight loss.  HENT: Negative for ear discharge, ear pain, hearing loss and tinnitus.   Eyes: Negative for blurred vision, double vision, photophobia and pain.  Respiratory: Negative for cough and shortness of breath.   Cardiovascular: Negative for chest pain and palpitations.  Gastrointestinal: Negative for abdominal pain, blood in stool, constipation, diarrhea, heartburn, melena, nausea and vomiting.  Genitourinary: Negative for dysuria, flank pain, frequency, hematuria and urgency.  Musculoskeletal: Positive for back pain. Negative for falls.  Neurological: Negative for dizziness, loss of consciousness and headaches.  Endo/Heme/Allergies: Negative for environmental allergies.  Psychiatric/Behavioral: Negative for depression, hallucinations, substance abuse and suicidal ideas. The patient is not nervous/anxious and does not have insomnia.     BP 126/78 (BP Location: Right Arm, Patient Position: Sitting, Cuff Size: Large)   Pulse 80   Temp 97.5 F (36.4 C) (Oral)   Resp 16   Ht 6' (1.829 m)   Wt 281 lb 4 oz (127.6 kg)   SpO2 98%   BMI 38.14 kg/m   Physical  Exam  Constitutional: He is oriented to person, place, and time and well-developed, well-nourished, and in no distress.  HENT:  Head: Normocephalic and atraumatic.  Right Ear: External ear normal.  Left Ear: External ear normal.  Nose: Nose normal.  Mouth/Throat: Oropharynx is clear and moist. No oropharyngeal exudate.  Eyes: Conjunctivae and EOM are normal. Pupils are equal, round, and reactive to light.  Neck: Neck supple. No thyromegaly present.  Cardiovascular: Normal rate,  regular rhythm, normal heart sounds and intact distal pulses.   Pulmonary/Chest: Effort normal and breath sounds normal. No respiratory distress. He has no wheezes. He has no rales. He exhibits no tenderness.  Abdominal: Soft. Bowel sounds are normal. He exhibits no distension and no mass. There is no tenderness. There is no rebound and no guarding.  Genitourinary: Testes/scrotum normal and penis normal. No discharge found.  Musculoskeletal:       Lumbar back: He exhibits spasm. He exhibits no tenderness, no bony tenderness and no pain.  Lymphadenopathy:    He has no cervical adenopathy.  Neurological: He is alert and oriented to person, place, and time.  Skin: Skin is warm and dry. No rash noted.  Psychiatric: Affect normal.  Vitals reviewed.   Recent Results (from the past 2160 hour(s))  CBC     Status: None   Collection Time: 06/06/16 10:57 AM  Result Value Ref Range   WBC 8.0 4.0 - 10.5 K/uL   RBC 4.84 4.22 - 5.81 Mil/uL   Platelets 233.0 150.0 - 400.0 K/uL   Hemoglobin 14.4 13.0 - 17.0 g/dL   HCT 16.1 09.6 - 04.5 %   MCV 86.7 78.0 - 100.0 fl   MCHC 34.4 30.0 - 36.0 g/dL   RDW 40.9 81.1 - 91.4 %  Comprehensive metabolic panel     Status: None   Collection Time: 06/06/16 10:57 AM  Result Value Ref Range   Sodium 139 135 - 145 mEq/L   Potassium 4.3 3.5 - 5.1 mEq/L   Chloride 104 96 - 112 mEq/L   CO2 32 19 - 32 mEq/L   Glucose, Bld 91 70 - 99 mg/dL   BUN 13 6 - 23 mg/dL   Creatinine, Ser 7.82 0.40 - 1.50 mg/dL   Total Bilirubin 0.9 0.2 - 1.2 mg/dL   Alkaline Phosphatase 59 39 - 117 U/L   AST 24 0 - 37 U/L   ALT 27 0 - 53 U/L   Total Protein 7.7 6.0 - 8.3 g/dL   Albumin 4.6 3.5 - 5.2 g/dL   Calcium 9.7 8.4 - 95.6 mg/dL   GFR 21.30 >86.57 mL/min  TSH     Status: None   Collection Time: 06/06/16 10:57 AM  Result Value Ref Range   TSH 1.10 0.35 - 4.50 uIU/mL  Lipid panel     Status: Abnormal   Collection Time: 06/06/16 10:57 AM  Result Value Ref Range   Cholesterol 185  0 - 200 mg/dL    Comment: ATP III Classification       Desirable:  < 200 mg/dL               Borderline High:  200 - 239 mg/dL          High:  > = 846 mg/dL   Triglycerides 962.9 0.0 - 149.0 mg/dL    Comment: Normal:  <528 mg/dLBorderline High:  150 - 199 mg/dL   HDL 41.32 >44.01 mg/dL   VLDL 02.7 0.0 - 25.3 mg/dL   LDL Cholesterol 664 (H)  0 - 99 mg/dL   Total CHOL/HDL Ratio 4     Comment:                Men          Women1/2 Average Risk     3.4          3.3Average Risk          5.0          4.42X Average Risk          9.6          7.13X Average Risk          15.0          11.0                       NonHDL 143.77     Comment: NOTE:  Non-HDL goal should be 30 mg/dL higher than patient's LDL goal (i.e. LDL goal of < 70 mg/dL, would have non-HDL goal of < 100 mg/dL)  Hemoglobin N5A     Status: None   Collection Time: 06/06/16 10:57 AM  Result Value Ref Range   Hgb A1c MFr Bld 5.3 4.6 - 6.5 %    Comment: Glycemic Control Guidelines for People with Diabetes:Non Diabetic:  <6%Goal of Therapy: <7%Additional Action Suggested:  >8%   Urinalysis, Routine w reflex microscopic (not at [facility])     Status: None   Collection Time: 06/06/16 10:57 AM  Result Value Ref Range   Color, Urine YELLOW Yellow;Lt. Yellow   APPearance CLEAR Clear   Specific Gravity, Urine 1.010 1.000 - 1.030   pH 7.0 5.0 - 8.0   Total Protein, Urine NEGATIVE Negative   Urine Glucose NEGATIVE Negative   Ketones, ur NEGATIVE Negative   Bilirubin Urine NEGATIVE Negative   Hgb urine dipstick NEGATIVE Negative   Urobilinogen, UA 0.2 0.0 - 1.0   Leukocytes, UA NEGATIVE Negative   Nitrite NEGATIVE Negative   WBC, UA none seen 0-2/hpf   RBC / HPF none seen 0-2/hpf   Squamous Epithelial / LPF Rare(0-4/hpf) Rare(0-4/hpf)    Assessment/Plan: Essential hypertension BP remains well-controlled. No further intervention needed.  Attention deficit hyperactivity disorder (ADHD) Is doing very well. Will continue current regimen.  Medication refills will be sent in.   Visit for preventive health examination Depression screen negative. Health Maintenance reviewed -- up-to-date. Preventive schedule discussed and handout given in AVS. Will obtain fasting labs today.     Piedad Climes, PA-C

## 2016-06-06 NOTE — Patient Instructions (Signed)
Please go to the lab for blood work.   Our office will call you with your results unless you have chosen to receive results via MyChart.  If your blood work is normal we will follow-up each year for physicals and as scheduled for chronic medical problems.  If anything is abnormal we will treat accordingly and get you in for a follow-up.  Make sure to clean your contacts regularly. Your symptoms seem consistent with sinus pressure. Use the Flonase as discussed when needed.  Decrease your acid reflux medication to every other day dosing. Let me know how this works for you.  The know seems consistent with muscle tension. Try getting a massage to alleviate it.  Get a lumbar support for your lower back.  Preventive Care for Adults, Male A healthy lifestyle and preventive care can promote health and wellness. Preventive health guidelines for men include the following key practices:  A routine yearly physical is a good way to check with your health care provider about your health and preventative screening. It is a chance to share any concerns and updates on your health and to receive a thorough exam.  Visit your dentist for a routine exam and preventative care every 6 months. Brush your teeth twice a day and floss once a day. Good oral hygiene prevents tooth decay and gum disease.  The frequency of eye exams is based on your age, health, family medical history, use of contact lenses, and other factors. Follow your health care provider's recommendations for frequency of eye exams.  Eat a healthy diet. Foods such as vegetables, fruits, whole grains, low-fat dairy products, and lean protein foods contain the nutrients you need without too many calories. Decrease your intake of foods high in solid fats, added sugars, and salt. Eat the right amount of calories for you.Get information about a proper diet from your health care provider, if necessary.  Regular physical exercise is one of the most  important things you can do for your health. Most adults should get at least 150 minutes of moderate-intensity exercise (any activity that increases your heart rate and causes you to sweat) each week. In addition, most adults need muscle-strengthening exercises on 2 or more days a week.  Maintain a healthy weight. The body mass index (BMI) is a screening tool to identify possible weight problems. It provides an estimate of body fat based on height and weight. Your health care provider can find your BMI and can help you achieve or maintain a healthy weight.For adults 20 years and older:  A BMI below 18.5 is considered underweight.  A BMI of 18.5 to 24.9 is normal.  A BMI of 25 to 29.9 is considered overweight.  A BMI of 30 and above is considered obese.  Maintain normal blood lipids and cholesterol levels by exercising and minimizing your intake of saturated fat. Eat a balanced diet with plenty of fruit and vegetables. Blood tests for lipids and cholesterol should begin at age 51 and be repeated every 5 years. If your lipid or cholesterol levels are high, you are over 50, or you are at high risk for heart disease, you may need your cholesterol levels checked more frequently.Ongoing high lipid and cholesterol levels should be treated with medicines if diet and exercise are not working.  If you smoke, find out from your health care provider how to quit. If you do not use tobacco, do not start.  Lung cancer screening is recommended for adults aged 73-80 years who are  at high risk for developing lung cancer because of a history of smoking. A yearly low-dose CT scan of the lungs is recommended for people who have at least a 30-pack-year history of smoking and are a current smoker or have quit within the past 15 years. A pack year of smoking is smoking an average of 1 pack of cigarettes a day for 1 year (for example: 1 pack a day for 30 years or 2 packs a day for 15 years). Yearly screening should  continue until the smoker has stopped smoking for at least 15 years. Yearly screening should be stopped for people who develop a health problem that would prevent them from having lung cancer treatment.  If you choose to drink alcohol, do not have more than 2 drinks per day. One drink is considered to be 12 ounces (355 mL) of beer, 5 ounces (148 mL) of wine, or 1.5 ounces (44 mL) of liquor.  Avoid use of street drugs. Do not share needles with anyone. Ask for help if you need support or instructions about stopping the use of drugs.  High blood pressure causes heart disease and increases the risk of stroke. Your blood pressure should be checked at least every 1-2 years. Ongoing high blood pressure should be treated with medicines, if weight loss and exercise are not effective.  If you are 9-61 years old, ask your health care provider if you should take aspirin to prevent heart disease.  Diabetes screening is done by taking a blood sample to check your blood glucose level after you have not eaten for a certain period of time (fasting). If you are not overweight and you do not have risk factors for diabetes, you should be screened once every 3 years starting at age 37. If you are overweight or obese and you are 77-74 years of age, you should be screened for diabetes every year as part of your cardiovascular risk assessment.  Colorectal cancer can be detected and often prevented. Most routine colorectal cancer screening begins at the age of 43 and continues through age 44. However, your health care provider may recommend screening at an earlier age if you have risk factors for colon cancer. On a yearly basis, your health care provider may provide home test kits to check for hidden blood in the stool. Use of a small camera at the end of a tube to directly examine the colon (sigmoidoscopy or colonoscopy) can detect the earliest forms of colorectal cancer. Talk to your health care provider about this at age  2, when routine screening begins. Direct exam of the colon should be repeated every 5-10 years through age 75, unless early forms of precancerous polyps or small growths are found.  People who are at an increased risk for hepatitis B should be screened for this virus. You are considered at high risk for hepatitis B if:  You were born in a country where hepatitis B occurs often. Talk with your health care provider about which countries are considered high risk.  Your parents were born in a high-risk country and you have not received a shot to protect against hepatitis B (hepatitis B vaccine).  You have HIV or AIDS.  You use needles to inject street drugs.  You live with, or have sex with, someone who has hepatitis B.  You are a man who has sex with other men (MSM).  You get hemodialysis treatment.  You take certain medicines for conditions such as cancer, organ transplantation, and autoimmune  conditions.  Hepatitis C blood testing is recommended for all people born from [birth year] through 1965 and any individual with known risks for hepatitis C.  Practice safe sex. Use condoms and avoid high-risk sexual practices to reduce the spread of sexually transmitted infections (STIs). STIs include gonorrhea, chlamydia, syphilis, trichomonas, herpes, HPV, and human immunodeficiency virus (HIV). Herpes, HIV, and HPV are viral illnesses that have no cure. They can result in disability, cancer, and death.  If you are a man who has sex with other men, you should be screened at least once per year for:  HIV.  Urethral, rectal, and pharyngeal infection of gonorrhea, chlamydia, or both.  If you are at risk of being infected with HIV, it is recommended that you take a prescription medicine daily to prevent HIV infection. This is called preexposure prophylaxis (PrEP). You are considered at risk if:  You are a man who has sex with other men (MSM) and have other risk factors.  You are a heterosexual man, are  sexually active, and are at increased risk for HIV infection.  You take drugs by injection.  You are sexually active with a partner who has HIV.  Talk with your health care provider about whether you are at high risk of being infected with HIV. If you choose to begin PrEP, you should first be tested for HIV. You should then be tested every 3 months for as long as you are taking PrEP.  A one-time screening for abdominal aortic aneurysm (AAA) and surgical repair of large AAAs by ultrasound are recommended for men ages 76 to 33 years who are current or former smokers.  Healthy men should no longer receive prostate-specific antigen (PSA) blood tests as part of routine cancer screening. Talk with your health care provider about prostate cancer screening.  Testicular cancer screening is not recommended for adult males who have no symptoms. Screening includes self-exam, a health care provider exam, and other screening tests. Consult with your health care provider about any symptoms you have or any concerns you have about testicular cancer.  Use sunscreen. Apply sunscreen liberally and repeatedly throughout the day. You should seek shade when your shadow is shorter than you. Protect yourself by wearing long sleeves, pants, a wide-brimmed hat, and sunglasses year round, whenever you are outdoors.  Once a month, do a whole-body skin exam, using a mirror to look at the skin on your back. Tell your health care provider about new moles, moles that have irregular borders, moles that are larger than a pencil eraser, or moles that have changed in shape or color.  Stay current with required vaccines (immunizations).  Influenza vaccine. All adults should be immunized every year.  Tetanus, diphtheria, and acellular pertussis (Td, Tdap) vaccine. An adult who has not previously received Tdap or who does not know his vaccine status should receive 1 dose of Tdap. This initial dose should be followed by tetanus and  diphtheria toxoids (Td) booster doses every 10 years. Adults with an unknown or incomplete history of completing a 3-dose immunization series with Td-containing vaccines should begin or complete a primary immunization series including a Tdap dose. Adults should receive a Td booster every 10 years.  Varicella vaccine. An adult without evidence of immunity to varicella should receive 2 doses or a second dose if he has previously received 1 dose.  Human papillomavirus (HPV) vaccine. Males aged 11-21 years who have not received the vaccine previously should receive the 3-dose series. Males aged 22-26 years may be  immunized. Immunization is recommended through the age of 68 years for any male who has sex with males and did not get any or all doses earlier. Immunization is recommended for any person with an immunocompromised condition through the age of 57 years if he did not get any or all doses earlier. During the 3-dose series, the second dose should be obtained 4-8 weeks after the first dose. The third dose should be obtained 24 weeks after the first dose and 16 weeks after the second dose.  Zoster vaccine. One dose is recommended for adults aged 53 years or older unless certain conditions are present.  Measles, mumps, and rubella (MMR) vaccine. Adults born before [birth year] generally are considered immune to measles and mumps. Adults born in [birth year] or later should have 1 or more doses of MMR vaccine unless there is a contraindication to the vaccine or there is laboratory evidence of immunity to each of the three diseases. A routine second dose of MMR vaccine should be obtained at least 28 days after the first dose for students attending postsecondary schools, health care workers, or international travelers. People who received inactivated measles vaccine or an unknown type of measles vaccine during 1963-1967 should receive 2 doses of MMR vaccine. People who received inactivated mumps vaccine or an unknown type of  mumps vaccine before 1979 and are at high risk for mumps infection should consider immunization with 2 doses of MMR vaccine. Unvaccinated health care workers born before [birth year] who lack laboratory evidence of measles, mumps, or rubella immunity or laboratory confirmation of disease should consider measles and mumps immunization with 2 doses of MMR vaccine or rubella immunization with 1 dose of MMR vaccine.  Pneumococcal 13-valent conjugate (PCV13) vaccine. When indicated, a person who is uncertain of his immunization history and has no record of immunization should receive the PCV13 vaccine. All adults 46 years of age and older should receive this vaccine. An adult aged 20 years or older who has certain medical conditions and has not been previously immunized should receive 1 dose of PCV13 vaccine. This PCV13 should be followed with a dose of pneumococcal polysaccharide (PPSV23) vaccine. Adults who are at high risk for pneumococcal disease should obtain the PPSV23 vaccine at least 8 weeks after the dose of PCV13 vaccine. Adults older than 33 years of age who have normal immune system function should obtain the PPSV23 vaccine dose at least 1 year after the dose of PCV13 vaccine.  Pneumococcal polysaccharide (PPSV23) vaccine. When PCV13 is also indicated, PCV13 should be obtained first. All adults aged 63 years and older should be immunized. An adult younger than age 90 years who has certain medical conditions should be immunized. Any person who resides in a nursing home or long-term care facility should be immunized. An adult smoker should be immunized. People with an immunocompromised condition and certain other conditions should receive both PCV13 and PPSV23 vaccines. People with human immunodeficiency virus (HIV) infection should be immunized as soon as possible after diagnosis. Immunization during chemotherapy or radiation therapy should be avoided. Routine use of PPSV23 vaccine is not recommended for [place]ians, [place]atives, or people younger than 65 years unless there are medical conditions that require PPSV23 vaccine. When indicated, people who have unknown immunization and have no record of immunization should receive PPSV23 vaccine. One-time revaccination 5 years after the first dose of PPSV23 is recommended for people aged 19-64 years who have chronic kidney failure, nephrotic syndrome, asplenia, or immunocompromised conditions. People who received 1-2  doses of PPSV23 before age 88 years should receive another dose of PPSV23 vaccine at age 68 years or later if at least 5 years have passed since the previous dose. Doses of PPSV23 are not needed for people immunized with PPSV23 at or after age 6 years.  Meningococcal vaccine. Adults with asplenia or persistent complement component deficiencies should receive 2 doses of quadrivalent meningococcal conjugate (MenACWY-D) vaccine. The doses should be obtained at least 2 months apart. Microbiologists working with certain meningococcal bacteria, [employer], people at risk during an outbreak, and people who travel to or live in countries with a high rate of meningitis should be immunized. A first-year college student up through age 28 years who is living in a residence hall should receive a dose if he did not receive a dose on or after his 16th birthday. Adults who have certain high-risk conditions should receive one or more doses of vaccine.  Hepatitis A vaccine. Adults who wish to be protected from this disease, have chronic liver disease, work with hepatitis A-infected animals, work in hepatitis A research labs, or travel to or work in countries with a high rate of hepatitis A should be immunized. Adults who were previously unvaccinated and who anticipate close contact with an international adoptee during the first 60 days after arrival in the [place] from a country with a high rate of hepatitis A should be immunized.  Hepatitis B vaccine.  Adults should be immunized if they wish to be protected from this disease, are under age 1 years and have diabetes, have chronic liver disease, have had more than one sex partner in the past 6 months, may be exposed to blood or other infectious body fluids, are household contacts or sex partners of hepatitis B positive people, are clients or workers in certain care facilities, or travel to or work in countries with a high rate of hepatitis B.  Haemophilus influenzae type b (Hib) vaccine. A previously unvaccinated person with asplenia or sickle cell disease or having a scheduled splenectomy should receive 1 dose of Hib vaccine. Regardless of previous immunization, a recipient of a hematopoietic stem cell transplant should receive a 3-dose series 6-12 months after his successful transplant. Hib vaccine is not recommended for adults with HIV infection. Preventive Service / Frequency Ages 49 to 67  Blood pressure check.** / Every 3-5 years.  Lipid and cholesterol check.** / Every 5 years beginning at age 5.  Hepatitis C blood test.** / For any individual with known risks for hepatitis C.  Skin self-exam. / Monthly.  Influenza vaccine. / Every year.  Tetanus, diphtheria, and acellular pertussis (Tdap, Td) vaccine.** / Consult your health care provider. 1 dose of Td every 10 years.  Varicella vaccine.** / Consult your health care provider.  HPV vaccine. / 3 doses over 6 months, if 62 or younger.  Measles, mumps, rubella (MMR) vaccine.** / You need at least 1 dose of MMR if you were born in 1957 or later. You may also need a second dose.  Pneumococcal 13-valent conjugate (PCV13) vaccine.** / Consult your health care provider.  Pneumococcal polysaccharide (PPSV23) vaccine.** / 1 to 2 doses if you smoke cigarettes or if you have certain conditions.  Meningococcal vaccine.** / 1 dose if you are age 62 to 75 years and a [employer] researcher living in a residence hall, or have one of several  medical conditions. You may also need additional booster doses.  Hepatitis A vaccine.** / Consult your health care provider.  Hepatitis  B vaccine.** / Consult your health care provider.  Haemophilus influenzae type b (Hib) vaccine.** / Consult your health care provider. Ages 52 to 20  Blood pressure check.** / Every year.  Lipid and cholesterol check.** / Every 5 years beginning at age 69.  Lung cancer screening. / Every year if you are aged 28-80 years and have a 30-pack-year history of smoking and currently smoke or have quit within the past 15 years. Yearly screening is stopped once you have quit smoking for at least 15 years or develop a health problem that would prevent you from having lung cancer treatment.  Fecal occult blood test (FOBT) of stool. / Every year beginning at age 68 and continuing until age 34. You may not have to do this test if you get a colonoscopy every 10 years.  Flexible sigmoidoscopy** or colonoscopy.** / Every 5 years for a flexible sigmoidoscopy or every 10 years for a colonoscopy beginning at age 48 and continuing until age 54.  Hepatitis C blood test.** / For all people born from [birth year] through 1965 and any individual with known risks for hepatitis C.  Skin self-exam. / Monthly.  Influenza vaccine. / Every year.  Tetanus, diphtheria, and acellular pertussis (Tdap/Td) vaccine.** / Consult your health care provider. 1 dose of Td every 10 years.  Varicella vaccine.** / Consult your health care provider.  Zoster vaccine.** / 1 dose for adults aged 5 years or older.  Measles, mumps, rubella (MMR) vaccine.** / You need at least 1 dose of MMR if you were born in 1957 or later. You may also need a second dose.  Pneumococcal 13-valent conjugate (PCV13) vaccine.** / Consult your health care provider.  Pneumococcal polysaccharide (PPSV23) vaccine.** / 1 to 2 doses if you smoke cigarettes or if you have certain conditions.  Meningococcal vaccine.** / Consult  your health care provider.  Hepatitis A vaccine.** / Consult your health care provider.  Hepatitis B vaccine.** / Consult your health care provider.  Haemophilus influenzae type b (Hib) vaccine.** / Consult your health care provider. Ages 46 and over  Blood pressure check.** / Every year.  Lipid and cholesterol check.**/ Every 5 years beginning at age 25.  Lung cancer screening. / Every year if you are aged 46-80 years and have a 30-pack-year history of smoking and currently smoke or have quit within the past 15 years. Yearly screening is stopped once you have quit smoking for at least 15 years or develop a health problem that would prevent you from having lung cancer treatment.  Fecal occult blood test (FOBT) of stool. / Every year beginning at age 61 and continuing until age 71. You may not have to do this test if you get a colonoscopy every 10 years.  Flexible sigmoidoscopy** or colonoscopy.** / Every 5 years for a flexible sigmoidoscopy or every 10 years for a colonoscopy beginning at age 80 and continuing until age 5.  Hepatitis C blood test.** / For all people born from [birth year] through 1965 and any individual with known risks for hepatitis C.  Abdominal aortic aneurysm (AAA) screening.** / A one-time screening for ages 59 to 39 years who are current or former smokers.  Skin self-exam. / Monthly.  Influenza vaccine. / Every year.  Tetanus, diphtheria, and acellular pertussis (Tdap/Td) vaccine.** / 1 dose of Td every 10 years.  Varicella vaccine.** / Consult your health care provider.  Zoster vaccine.** / 1 dose for adults aged 22 years or older.  Pneumococcal 13-valent conjugate (PCV13) vaccine.** / 1  dose for all adults aged 27 years and older.  Pneumococcal polysaccharide (PPSV23) vaccine.** / 1 dose for all adults aged 31 years and older.  Meningococcal vaccine.** / Consult your health care provider.  Hepatitis A vaccine.** / Consult your health care  provider.  Hepatitis B vaccine.** / Consult your health care provider.  Haemophilus influenzae type b (Hib) vaccine.** / Consult your health care provider. **Family history and personal history of risk and conditions may change your health care provider's recommendations.   This information is not intended to replace advice given to you by your health care provider. Make sure you discuss any questions you have with your health care provider.   Document Released: 12/23/2001 Document Revised: 11/17/2014 Document Reviewed: 03/24/2011 Elsevier Interactive Patient Education [employer].

## 2016-06-06 NOTE — Progress Notes (Signed)
Pre visit review using our clinic review tool, if applicable. No additional management support is needed unless otherwise documented below in the visit note/SLS  

## 2016-06-08 DIAGNOSIS — M545 Low back pain, unspecified: Secondary | ICD-10-CM | POA: Insufficient documentation

## 2016-06-08 NOTE — Assessment & Plan Note (Signed)
Intermittent mild symptoms. Mild spasm noted. Does not want Rx medication. Discussed OTC medications, stretching and potential benefit from massage therapy.

## 2016-06-08 NOTE — Assessment & Plan Note (Signed)
Is doing very well. Will continue current regimen. Medication refills will be sent in.

## 2016-06-08 NOTE — Assessment & Plan Note (Signed)
BP remains well-controlled. No further intervention needed.

## 2016-06-08 NOTE — Assessment & Plan Note (Signed)
Depression screen negative. Health Maintenance reviewed -- up-to-date. Preventive schedule discussed and handout given in AVS. Will obtain fasting labs today.  

## 2016-06-10 ENCOUNTER — Telehealth: Payer: Self-pay | Admitting: *Deleted

## 2016-06-10 LAB — NICOTINE/COTININE METABOLITES

## 2016-06-10 NOTE — Telephone Encounter (Signed)
Patient informed, understood Wellness Form complete and faxed & Wellness Form and copy of results mailed at pt request/SLS 08/01

## 2016-11-25 ENCOUNTER — Telehealth: Payer: Self-pay | Admitting: Physician Assistant

## 2016-11-25 ENCOUNTER — Other Ambulatory Visit: Payer: Self-pay | Admitting: Emergency Medicine

## 2016-11-25 DIAGNOSIS — F900 Attention-deficit hyperactivity disorder, predominantly inattentive type: Secondary | ICD-10-CM

## 2016-11-25 MED ORDER — BUPROPION HCL ER (XL) 150 MG PO TB24: 150.0000 mg | ORAL_TABLET | Freq: Every morning | ORAL | 1 refills | Status: DC

## 2016-11-25 MED ORDER — OMEPRAZOLE 20 MG PO TBEC: 20.0000 mg | DELAYED_RELEASE_TABLET | Freq: Every day | ORAL | 1 refills | Status: DC

## 2016-11-25 NOTE — Telephone Encounter (Signed)
Sent both medications into the pharmacy until physical in July.

## 2016-11-25 NOTE — Telephone Encounter (Signed)
Relation to ZO:XWRUpt:self Call back number:[phone] [facility]: [facility] [address] [place] [address] [phone] (Phone) [phone] (Fax)     Reason for call:  Patient requesting 6 month supply buPROPion (WELLBUTRIN XL) 150 MG 24 hr tablet and Omeprazole 20 MG TBEC to hold him over until July physical appointment, please advise

## 2017-04-09 ENCOUNTER — Telehealth: Payer: Self-pay | Admitting: Physician Assistant

## 2017-04-09 NOTE — Telephone Encounter (Signed)
A year since last seen. He can switch to me when he actually comes in and sees me. Offer him appointment in July. Offer early am or early afternoon appointment. Can be physical if he chooses o just check up/get estalblshed visit.

## 2017-04-09 NOTE — Telephone Encounter (Signed)
Ok with me 

## 2017-04-09 NOTE — Telephone Encounter (Signed)
Patient would like to transfer [place] due to [place] being to far,please advise

## 2017-04-10 NOTE — Telephone Encounter (Signed)
Patient scheduled for 07/07/2017 with [facility] [place]

## 2017-05-07 ENCOUNTER — Other Ambulatory Visit: Payer: Self-pay | Admitting: Emergency Medicine

## 2017-05-07 ENCOUNTER — Telehealth: Payer: Self-pay | Admitting: Physician Assistant

## 2017-05-07 DIAGNOSIS — F900 Attention-deficit hyperactivity disorder, predominantly inattentive type: Secondary | ICD-10-CM

## 2017-05-07 MED ORDER — BUPROPION HCL ER (XL) 150 MG PO TB24: 150.0000 mg | ORAL_TABLET | Freq: Every morning | ORAL | 1 refills | Status: DC

## 2017-05-07 MED ORDER — OMEPRAZOLE 20 MG PO TBEC: 20.0000 mg | DELAYED_RELEASE_TABLET | Freq: Every day | ORAL | 1 refills | Status: DC

## 2017-05-07 NOTE — Telephone Encounter (Signed)
Pt asking if Selena BattenCody would call in refills for bupropion and omeprazole to [facility] in [place], pt will be transferring to [place] in Aug., but will run out before then.

## 2017-05-07 NOTE — Telephone Encounter (Signed)
Rx sent to the preferred patient pharmacy  

## 2017-06-08 ENCOUNTER — Encounter: Payer: 59 | Admitting: Physician Assistant

## 2017-07-07 ENCOUNTER — Encounter: Payer: Self-pay | Admitting: Medical

## 2017-07-07 ENCOUNTER — Ambulatory Visit (INDEPENDENT_AMBULATORY_CARE_PROVIDER_SITE_OTHER): Payer: 59 | Admitting: Medical

## 2017-07-07 ENCOUNTER — Telehealth (INDEPENDENT_AMBULATORY_CARE_PROVIDER_SITE_OTHER): Payer: 59 | Admitting: Medical

## 2017-07-07 VITALS — BP 130/90 | HR 84 | Temp 98.1°F | Resp 14 | Ht 73.5 in | Wt 291.0 lb

## 2017-07-07 DIAGNOSIS — F900 Attention-deficit hyperactivity disorder, predominantly inattentive type: Secondary | ICD-10-CM | POA: Diagnosis not present

## 2017-07-07 DIAGNOSIS — Z0189 Encounter for other specified special examinations: Secondary | ICD-10-CM | POA: Diagnosis not present

## 2017-07-07 DIAGNOSIS — Z0000 Encounter for general adult medical examination without abnormal findings: Secondary | ICD-10-CM

## 2017-07-07 LAB — CBC WITH DIFFERENTIAL/PLATELET
BASOS ABS: 0.1 10*3/uL (ref 0.0–0.1)
Basophils Relative: 0.9 % (ref 0.0–3.0)
EOS ABS: 0.3 10*3/uL (ref 0.0–0.7)
Eosinophils Relative: 3.3 % (ref 0.0–5.0)
HCT: 45.6 % (ref 39.0–52.0)
HEMOGLOBIN: 15.2 g/dL (ref 13.0–17.0)
Lymphocytes Relative: 29.4 % (ref 12.0–46.0)
Lymphs Abs: 2.3 10*3/uL (ref 0.7–4.0)
MCHC: 33.3 g/dL (ref 30.0–36.0)
MCV: 89 fl (ref 78.0–100.0)
MONO ABS: 0.8 10*3/uL (ref 0.1–1.0)
Monocytes Relative: 9.8 % (ref 3.0–12.0)
NEUTROS PCT: 56.6 % (ref 43.0–77.0)
Neutro Abs: 4.4 10*3/uL (ref 1.4–7.7)
Platelets: 244 10*3/uL (ref 150.0–400.0)
RBC: 5.12 Mil/uL (ref 4.22–5.81)
RDW: 12.9 % (ref 11.5–15.5)
WBC: 7.8 10*3/uL (ref 4.0–10.5)

## 2017-07-07 LAB — LIPID PANEL
CHOLESTEROL: 169 mg/dL (ref 0–200)
HDL: 37.4 mg/dL — ABNORMAL LOW (ref >39.00)
LDL CALC: 106 mg/dL — AB (ref 0–99)
NONHDL: 131.44
Total CHOL/HDL Ratio: 5
Triglycerides: 129 mg/dL (ref 0.0–149.0)
VLDL: 25.8 mg/dL (ref 0.0–40.0)

## 2017-07-07 LAB — COMPREHENSIVE METABOLIC PANEL
ALBUMIN: 4.6 g/dL (ref 3.5–5.2)
ALK PHOS: 55 U/L (ref 39–117)
ALT: 22 U/L (ref 0–53)
AST: 23 U/L (ref 0–37)
BUN: 13 mg/dL (ref 6–23)
CO2: 33 mEq/L — ABNORMAL HIGH (ref 19–32)
CREATININE: 1.14 mg/dL (ref 0.40–1.50)
Calcium: 10.5 mg/dL (ref 8.4–10.5)
Chloride: 103 mEq/L (ref 96–112)
GFR: 78.11 mL/min (ref >60.00)
GLUCOSE: 94 mg/dL (ref 70–99)
Potassium: 4.6 mEq/L (ref 3.5–5.1)
SODIUM: 139 meq/L (ref 135–145)
TOTAL PROTEIN: 7.9 g/dL (ref 6.0–8.3)
Total Bilirubin: 0.7 mg/dL (ref 0.2–1.2)

## 2017-07-07 MED ORDER — BUPROPION HCL ER (XL) 150 MG PO TB24: 150.0000 mg | ORAL_TABLET | Freq: Every morning | ORAL | 1 refills | Status: DC

## 2017-07-07 MED ORDER — OMEPRAZOLE 20 MG PO TBEC
20.0000 mg | DELAYED_RELEASE_TABLET | Freq: Every day | ORAL | 1 refills | Status: DC
Start: 2017-07-07 — End: 2017-12-15

## 2017-07-07 NOTE — Progress Notes (Signed)
Subjective:    Patient ID: Greg Santos, male    DOB: 06/02/1981, 36 y.o.   MRN: [id number]  HPI  Pt in for first visit with me. Pt works as Radio producer, Pt is beginning to exercise some but admits could be better. States eats out a lot a times with traveling for work. But at home eats better at home. Married and has one child 39 years old. His wife may be pregnant.  Pt states he feels good.  Pt up to date on vaccines.  Pt offered to get flu vaccine to get in September or later if he chooses. He will think about getting.  Pt on wellbutrin xl. He has history of ADHD. Tried various stimulants in the past. He states Wellbutrin helps his concentration. He states not depressed.  Pt has some mild allergic rhinitis. Claritin helps.  Pt also has some hx of gerd. He is on omeprazole. He also states he had dilation of his esophagus.  He needs a physical. Nicotine screen for his employer is included in his physical.     Review of Systems  Constitutional: Negative for chills, fatigue and fever.  HENT: Negative for congestion, ear discharge, facial swelling, hearing loss, nosebleeds, postnasal drip, rhinorrhea, sinus pain and sore throat.   Respiratory: Negative for cough, chest tightness, shortness of breath and wheezing.   Cardiovascular: Negative for chest pain and palpitations.  Gastrointestinal: Negative for abdominal pain.  Genitourinary: Negative for dysuria, flank pain, frequency, testicular pain and urgency.  Musculoskeletal: Negative for back pain, joint swelling, myalgias and neck pain.  Neurological: Negative for dizziness, tremors, seizures, syncope, speech difficulty, weakness, numbness and headaches.  Hematological: Negative for adenopathy. Does not bruise/bleed easily.  Psychiatric/Behavioral: Positive for decreased concentration. Negative for behavioral problems, confusion, dysphoric mood and sleep disturbance. The patient is not nervous/anxious.        His attention  is improved with    Past Medical History:  Diagnosis Date  . ADD (attention deficit disorder)   . Hypertension   . Obesity      Social History   Social History  . Marital status: Married    Spouse name: N/A  . Number of children: N/A  . Years of education: N/A   Occupational History  . Not on file.   Social History Main Topics  . Smoking status: Never Smoker  . Smokeless tobacco: Never Used  . Alcohol use 0.6 oz/week    1 Cans of beer per week     Comment: once a week   . Drug use: No  . Sexual activity: Not on file   Other Topics Concern  . Not on file   Social History Narrative  . No narrative on file    Past Surgical History:  Procedure Laterality Date  . ESOPHAGOGASTRODUODENOSCOPY ENDOSCOPY N/A 2009    Family History  Problem Relation Age of Onset  . Coronary artery disease Unknown        first degree relative  . Coronary artery disease Father   . Stroke Paternal Grandmother   . Coronary artery disease Paternal Grandfather   . Heart attack Paternal Grandfather     Allergies  Allergen Reactions  . Tetracycline Rash    REACTION: Rash/Swelling    Current Outpatient Prescriptions on File Prior to Visit  Medication Sig Dispense Refill  . Loratadine (ALLERGY RELIEF REDI-TABS PO) Take 1 tablet by mouth daily as needed.      . Multiple Vitamins-Minerals (MENS MULTIVITAMIN PLUS) TABS Take  by mouth daily.     No current facility-administered medications on file prior to visit.     BP 130/90   Pulse 84   Temp 98.1 F (36.7 C) (Oral)   Resp 14   Ht 6' 1.5" (1.867 m)   Wt 291 lb (132 kg)   SpO2 97%   BMI 37.87 kg/m       Objective:   Physical Exam  General Mental Status- Alert. General Appearance- Not in acute distress.   Skin General: Color- Normal Color. Moisture- Normal Moisture.  Neck Carotid Arteries- Normal color. Moisture- Normal Moisture. No carotid bruits. No JVD.  Chest and Lung Exam Auscultation: Breath  Sounds:-Normal.  Cardiovascular Auscultation:Rythm- Regular. Murmurs & Other Heart Sounds:Auscultation of the heart reveals- No Murmurs.  Abdomen Inspection:-Inspeection Normal. Palpation/Percussion:Note:No mass. Palpation and Percussion of the abdomen reveal- Non Tender, Non Distended + BS, no rebound or guarding.    Neurologic Cranial Nerve exam:- CN III-XII intact(No nystagmus), symmetric smile. Finger to Nose:- Normal/Intact Strength:- 5/5 equal and symmetric strength both upper and lower extremities.  Genital exam-deferred     Assessment & Plan:  For you wellness exam today I have ordered cbc, cmp, lipid panel,  and ua.  Vaccine up-to-date.  Recommend exercise and healthy diet.  We will let you know lab results as they come in.  Admitting urine nicotine screening since this is test on you form.  Refilled your omeprazole and Wellbutrin. Rx advising given regarding Wellbutrin.  Follow up date appointment will be determined after lab review.   Note regarding patient's blood pressure level. Was 130/90 when I checked. Asked him to start checking his blood pressure at home and if blood pressure trend is higher than would recommend medication.  Arely Tinner, Ramon Dredge, PA-C

## 2017-07-07 NOTE — Patient Instructions (Addendum)
For you wellness exam today I have ordered cbc, cmp, lipid panel,  and ua.  Vaccine up-to-date.  Recommend exercise and healthy diet.  We will let you know lab results as they come in.  Admitting urine nicotine screening since this is test on you form.  Refilled your omeprazole and Wellbutrin. Rx advising given regarding Wellbutrin.  Follow up date appointment will be determined after lab review.    Preventive Care 18-39 Years, Male Preventive care refers to lifestyle choices and visits with your health care provider that can promote health and wellness. What does preventive care include?  A yearly physical exam. This is also called an annual well check.  Dental exams once or twice a year.  Routine eye exams. Ask your health care provider how often you should have your eyes checked.  Personal lifestyle choices, including: ? Daily care of your teeth and gums. ? Regular physical activity. ? Eating a healthy diet. ? Avoiding tobacco and drug use. ? Limiting alcohol use. ? Practicing safe sex. What happens during an annual well check? The services and screenings done by your health care provider during your annual well check will depend on your age, overall health, lifestyle risk factors, and family history of disease. Counseling Your health care provider may ask you questions about your:  Alcohol use.  Tobacco use.  Drug use.  Emotional well-being.  Home and relationship well-being.  Sexual activity.  Eating habits.  Work and work Statistician.  Screening You may have the following tests or measurements:  Height, weight, and BMI.  Blood pressure.  Lipid and cholesterol levels. These may be checked every 5 years starting at age 70.  Diabetes screening. This is done by checking your blood sugar (glucose) after you have not eaten for a while (fasting).  Skin check.  Hepatitis C blood test.  Hepatitis B blood test.  Sexually transmitted disease (STD)  testing.  Discuss your test results, treatment options, and if necessary, the need for more tests with your health care provider. Vaccines Your health care provider may recommend certain vaccines, such as:  Influenza vaccine. This is recommended every year.  Tetanus, diphtheria, and acellular pertussis (Tdap, Td) vaccine. You may need a Td booster every 10 years.  Varicella vaccine. You may need this if you have not been vaccinated.  HPV vaccine. If you are 80 or younger, you may need three doses over 6 months.  Measles, mumps, and rubella (MMR) vaccine. You may need at least one dose of MMR.You may also need a second dose.  Pneumococcal 13-valent conjugate (PCV13) vaccine. You may need this if you have certain conditions and have not been vaccinated.  Pneumococcal polysaccharide (PPSV23) vaccine. You may need one or two doses if you smoke cigarettes or if you have certain conditions.  Meningococcal vaccine. One dose is recommended if you are age 39-21 years and a first-year college student living in a residence hall, or if you have one of several medical conditions. You may also need additional booster doses.  Hepatitis A vaccine. You may need this if you have certain conditions or if you travel or work in places where you may be exposed to hepatitis A.  Hepatitis B vaccine. You may need this if you have certain conditions or if you travel or work in places where you may be exposed to hepatitis B.  Haemophilus influenzae type b (Hib) vaccine. You may need this if you have certain risk factors.  Talk to your health care provider about  which screenings and vaccines you need and how often you need them. This information is not intended to replace advice given to you by your health care provider. Make sure you discuss any questions you have with your health care provider. Document Released: 12/23/2001 Document Revised: 07/16/2016 Document Reviewed: 08/28/2015 Elsevier Interactive Patient  Education  2017 [employer].

## 2017-07-07 NOTE — Addendum Note (Signed)
Addended by: Harley Alto on: 07/07/2017 01:15 PM   Modules accepted: Orders

## 2017-07-07 NOTE — Progress Notes (Signed)
Pre visit review using our clinic review tool, if applicable. No additional management support is needed unless otherwise documented below in the visit note. 

## 2017-07-07 NOTE — Telephone Encounter (Signed)
Urine order placed so lab could result

## 2017-07-08 LAB — POC URINALSYSI DIPSTICK (AUTOMATED)
Bilirubin, UA: NEGATIVE
Blood, UA: NEGATIVE
Glucose, UA: NEGATIVE
Ketones, UA: NEGATIVE
LEUKOCYTES UA: NEGATIVE
NITRITE UA: NEGATIVE
PH UA: 6 (ref 5.0–8.0)
PROTEIN UA: NEGATIVE
Spec Grav, UA: 1.01 (ref 1.010–1.025)
UROBILINOGEN UA: 0.2 U/dL

## 2017-07-09 ENCOUNTER — Telehealth: Payer: Self-pay

## 2017-07-09 NOTE — Telephone Encounter (Signed)
-----   Message from Esperanza RichtersEdward Saguier, PA-C sent at 07/08/2017  7:44 PM EDT ----- Urine dip/poct negative in office. But his nicotine urine test still pending.

## 2017-07-09 NOTE — Telephone Encounter (Signed)
Called and informed Pt that Urine dip/poct negative in office. But his nicotine urine test still pending. Pt asked about how long it would be before test was resulted and I told pt that he'd be notified once the results come back in.

## 2017-07-10 LAB — NICOTINE AND COTININE LC/MS/MS, U
COTININE, URINE: <2 ng/mL
NICOTINE, URINE: <2 ng/mL

## 2017-07-15 ENCOUNTER — Telehealth: Payer: Self-pay | Admitting: Medical

## 2017-07-15 NOTE — Telephone Encounter (Signed)
I filled out pt tobacco testing form. If you would fill out my npi information and have pt pick up form or fax.

## 2017-07-16 ENCOUNTER — Encounter: Payer: Self-pay | Admitting: Medical

## 2017-07-16 NOTE — Progress Notes (Signed)
I called pt and advised that I have put his work physical form in the mail for him/a copy has been made to scan into chart/thx dmf

## 2017-12-15 ENCOUNTER — Other Ambulatory Visit: Payer: Self-pay | Admitting: Medical

## 2017-12-15 DIAGNOSIS — F900 Attention-deficit hyperactivity disorder, predominantly inattentive type: Secondary | ICD-10-CM

## 2017-12-31 ENCOUNTER — Other Ambulatory Visit: Payer: Self-pay | Admitting: Medical

## 2017-12-31 DIAGNOSIS — F900 Attention-deficit hyperactivity disorder, predominantly inattentive type: Secondary | ICD-10-CM

## 2017-12-31 NOTE — Telephone Encounter (Signed)
Patient called to inquire about his refill request, he says "I am out of town working and having to stay over another week. I only brought enough pills for 7 days, so if he can order me 1 weeks worth of both, that would be great. Otherwise my wife will have to overnight my medication, which will be too much to do." [facility], he verbalized understanding.  Last OV 07/07/17; Last filled 12/16/17 90 tab/o refills for both

## 2017-12-31 NOTE — Telephone Encounter (Signed)
Which medication is she referring to.  Is that the Wellbutrin or omeprazole.  Or both?  I just recently wrote him 90 tablets.  If I do send him another prescription his insurance company might not cover the prescription.  Therefore he might have to pay out of pocket for the prescription.  Where is he and what is the name of the pharmacy and address.  Also if he does have to pay cash it is possible [facility] would be the cheapest price.  It is really hard to say.  In any event, I need to know which medication and name of pharmacy along with address.  Patient should also be informed of potential issue regarding out-of-pocket cost since recently filled prescription.

## 2017-12-31 NOTE — Telephone Encounter (Signed)
Copied from CRM [id number]. Topic: Quick Communication - Rx Refill/Question >> Dec 31, 2017 11:31 AM Rudi CocoLathan, Alyza Artiaga M, NT wrote: Medication: Wellbutrin xl and Prilosec  Has the patient contacted their pharmacy? no   (Agent: If no, request that the patient contact the pharmacy for the refill.)   Preferred Pharmacy (with phone number or street name): [facility] [id number] - [place], [place] - [address] [facility] [address] [place] [id number] Phone: [phone] Fax: [phone]     Agent: Please be advised that RX refills may take up to 3 business days. We ask that you follow-up with your pharmacy.

## 2018-01-01 MED ORDER — OMEPRAZOLE 20 MG PO CPDR: 20.0000 mg | DELAYED_RELEASE_CAPSULE | Freq: Every day | ORAL | 0 refills | Status: DC

## 2018-01-01 MED ORDER — BUPROPION HCL ER (XL) 150 MG PO TB24: 150.0000 mg | ORAL_TABLET | Freq: Every morning | ORAL | 0 refills | Status: DC

## 2018-01-01 NOTE — Telephone Encounter (Signed)
Pt is needing both the Wellbutrin and the omeprazole sent to  [id number] - [place], [place] - [address] AT [facility] [address] [place] [id number] Phone: [phone] Fax: [phone]  He is out of town for work and his trip was extended by his employer unexpectedly. Pt aware he may have to pay out of pocket. Please notify pt when sent in [phone].   He needs to know by 1pm today because he does not want to go without his medication (will have to get his wife to overnight meds).

## 2018-01-01 NOTE — Telephone Encounter (Signed)
FYI

## 2018-01-01 NOTE — Telephone Encounter (Signed)
Patient called, I informed him of the note by Esperanza RichtersEdward Saguier, PA-C 12/31/17:  I just recently wrote him 90 tablets.  If I do send him another prescription his insurance company might not cover the prescription.  Therefore he might have to pay out of pocket for the prescription.  Where is he and what is the name of the pharmacy and address.  Also if he does have to pay cash it is possible [facility] would be the cheapest price.  It is really hard to say.  In any event, I need to know which medication and name of pharmacy along with address.  Patient should also be informed of potential issue regarding out-of-pocket cost since recently filled prescription. Patient verbalized understanding. He wants Greg Santos [name] to know he is staying in a hotel in [name], [name] and will be there for another week. He says the Wellbutrin XL and Omeprazole is what he needs, just 1 week supply of both, [facility] [address] [place], [name] [id number] Phone: [phone]

## 2018-01-01 NOTE — Telephone Encounter (Signed)
[facility] [name]

## 2018-01-01 NOTE — Telephone Encounter (Signed)
Would you go ahead and send him 1 week supply of Wellbutrin XL and omeprazole to the pharmacy he indicated.  Send it electronically if you can find it in epic or call the pharmacy directly.  Thanks

## 2018-05-10 ENCOUNTER — Telehealth: Payer: Self-pay

## 2018-05-10 ENCOUNTER — Telehealth: Payer: Self-pay | Admitting: Medical

## 2018-05-10 NOTE — Telephone Encounter (Unsigned)
Copied from CRM [id number]. Topic: Quick Communication - Rx Refill/Question >> May 10, 2018 11:36 AM Gaynelle AduPoole, Shalonda wrote: Medication: omeprazole (PRILOSEC) 20 MG capsule, buPROPion (WELLBUTRIN XL) 150 MG 24 hr tablet    Has the patient contacted their pharmacy? {no   Preferred Pharmacy (with phone number or street name):  [facility]: [address], [place], [place], [id number] (Ph: [phone])  Patient is requesting a three month supply for each prescription   Agent: Please be advised that RX refills may take up to 3 business days. We ask that you follow-up with your pharmacy.

## 2018-05-10 NOTE — Telephone Encounter (Signed)
Error

## 2018-05-10 NOTE — Telephone Encounter (Signed)
Pt last rx 01/01/18 #7 tablets. Okay to fill? Please advise

## 2018-05-10 NOTE — Telephone Encounter (Signed)
It has been about 10 months since I last saw him and I only saw him one time in the past.   I need him to follow up for appointment.   Appears 7 day rx was written until he he could get back in.   Has he been off medication since February??  With wellbutrin or similar meds(that effect mind/mood) that insurance wants a 90 day rx or if pt wants 90 days rx I typically want to see patient at least every 6 months.   So best for patient to schedule appointment.

## 2018-05-11 NOTE — Telephone Encounter (Signed)
Please call and schedule pt

## 2018-05-11 NOTE — Telephone Encounter (Signed)
Pt is scheduled for this wk May 14, 2018.

## 2018-05-14 ENCOUNTER — Encounter: Payer: Self-pay | Admitting: Medical

## 2018-05-14 ENCOUNTER — Ambulatory Visit (INDEPENDENT_AMBULATORY_CARE_PROVIDER_SITE_OTHER): Payer: 59 | Admitting: Medical

## 2018-05-14 ENCOUNTER — Ambulatory Visit: Payer: 59 | Admitting: Medical

## 2018-05-14 VITALS — BP 140/88 | HR 82 | Temp 98.3°F | Resp 16 | Ht 73.5 in | Wt 295.6 lb

## 2018-05-14 DIAGNOSIS — F900 Attention-deficit hyperactivity disorder, predominantly inattentive type: Secondary | ICD-10-CM | POA: Diagnosis not present

## 2018-05-14 DIAGNOSIS — K219 Gastro-esophageal reflux disease without esophagitis: Secondary | ICD-10-CM | POA: Diagnosis not present

## 2018-05-14 MED ORDER — OMEPRAZOLE 20 MG PO CPDR: 20.0000 mg | DELAYED_RELEASE_CAPSULE | Freq: Every day | ORAL | 3 refills | Status: DC

## 2018-05-14 MED ORDER — BUPROPION HCL ER (XL) 150 MG PO TB24: 150.0000 mg | ORAL_TABLET | Freq: Every morning | ORAL | 3 refills | Status: DC

## 2018-05-14 NOTE — Patient Instructions (Signed)
For your history of ADD, I did refill your Wellbutrin.  You have historically done well with this and will provide you with a 90-day prescription with 3 refills.  Rx advisement given.  Also did refill your omeprazole.  By your history and description appears that you have probable GERD.  Your blood pressure is a little bit elevated today and would recommend that you eat low-salt diet and try to get some regular exercise.  You have a BP cuff at home and would recommend checking 3-4 times a week.  If blood pressure is consistently exceeding 140/90 please let us know.  Follow-up in August for wellness exam.

## 2018-05-14 NOTE — Progress Notes (Signed)
Subjective:    Patient ID: Greg DeemStephen M Rabe, male    DOB: [birth year], 37 y.o.   MRN: [id number]  HPI  Pt in for follow up.  He is very busy at work.   Pt has history of reflux in past. I had written him brief prescription of omeprazole when I got refill request did give brief supply with hopes he would come in.. He has  history of esophageal stenosis.  He does note some irritation when he eats some spicy foods. Lying down after will causes brief cough.  Pt has history of ADHD. He has historically done well. No hx of depression. He did well wellbutrin. No history of seizure. States tried other ADD meds but wellbutrin has worked best for him.   Review of Systems  Constitutional: Negative for chills and fatigue.  Respiratory: Negative for cough, chest tightness, shortness of breath and wheezing.   Cardiovascular: Negative for chest pain and palpitations.  Gastrointestinal: Negative for abdominal distention, abdominal pain, diarrhea and nausea.       Possible reflux.  Musculoskeletal: Negative for back pain.  Skin: Negative for rash.  Hematological: Negative for adenopathy. Does not bruise/bleed easily.  Psychiatric/Behavioral: Positive for decreased concentration. Negative for behavioral problems, confusion, hallucinations, sleep disturbance and suicidal ideas. The patient is not nervous/anxious.    Past Medical History:  Diagnosis Date  . ADD (attention deficit disorder)   . Hypertension   . Obesity      Social History   Socioeconomic History  . Marital status: Married    Spouse name: Not on file  . Number of children: Not on file  . Years of education: Not on file  . Highest education level: Not on file  Occupational History  . Not on file  Social Needs  . Financial resource strain: Not on file  . Food insecurity:    Worry: Not on file    Inability: Not on file  . Transportation needs:    Medical: Not on file    Non-medical: Not on file  Tobacco Use  . Smoking  status: Never Smoker  . Smokeless tobacco: Never Used  Substance and Sexual Activity  . Alcohol use: Yes    Alcohol/week: 0.6 oz    Types: 1 Cans of beer per week    Comment: once a week   . Drug use: No  . Sexual activity: Not on file  Lifestyle  . Physical activity:    Days per week: Not on file    Minutes per session: Not on file  . Stress: Not on file  Relationships  . Social connections:    Talks on phone: Not on file    Gets together: Not on file    Attends religious service: Not on file    Active member of club or organization: Not on file    Attends meetings of clubs or organizations: Not on file    Relationship status: Not on file  . Intimate partner violence:    Fear of current or ex partner: Not on file    Emotionally abused: Not on file    Physically abused: Not on file    Forced sexual activity: Not on file  Other Topics Concern  . Not on file  Social History Narrative  . Not on file    Past Surgical History:  Procedure Laterality Date  . ESOPHAGOGASTRODUODENOSCOPY ENDOSCOPY N/A 2009    Family History  Problem Relation Age of Onset  . Coronary artery disease Unknown  first degree relative  . Coronary artery disease Father   . Stroke Paternal Grandmother   . Coronary artery disease Paternal Grandfather   . Heart attack Paternal Grandfather     Allergies  Allergen Reactions  . Tetracycline Rash    REACTION: Rash/Swelling    Current Outpatient Medications on File Prior to Visit  Medication Sig Dispense Refill  . buPROPion (WELLBUTRIN XL) 150 MG 24 hr tablet Take 1 tablet (150 mg total) by mouth every morning. 7 tablet 0  . Loratadine (ALLERGY RELIEF REDI-TABS PO) Take 1 tablet by mouth daily as needed.      . Multiple Vitamins-Minerals (MENS MULTIVITAMIN PLUS) TABS Take by mouth daily.    Marland Kitchen omeprazole (PRILOSEC) 20 MG capsule Take 1 capsule (20 mg total) by mouth daily. 7 capsule 0   No current facility-administered medications on file  prior to visit.     BP (!) 140/94   Pulse 82   Temp 98.3 F (36.8 C) (Oral)   Resp 16   Ht 6' 1.5" (1.867 m)   Wt 295 lb 9.6 oz (134.1 kg)   SpO2 97%   BMI 38.47 kg/m      Objective:   Physical Exam  General- No acute distress. Pleasant patient. Neck- Full range of motion, no jvd Lungs- Clear, even and unlabored. Heart- regular rate and rhythm. Neurologic- CNII- XII grossly intact.  Abdomen- soft, non-tender, non-distended. +bs. No rebound or guarding.  Back- no cva tenderness.      Assessment & Plan:  For your history of ADD, I did refill your Wellbutrin.  You have historically done well with this and will provide you with a 90-day prescription with 3 refills.  Rx advisement given.  Also did refill your omeprazole.  By your history and description appears that you have probable GERD.  Your blood pressure is a little bit elevated today and would recommend that you eat low-salt diet and try to get some regular exercise.  You have a BP cuff at home and would recommend checking 3-4 times a week.  If blood pressure is consistently exceeding 140/90 please let us know.  Follow-up in August for wellness exam.  Esperanza Richters, PA-C

## 2018-07-09 ENCOUNTER — Ambulatory Visit (INDEPENDENT_AMBULATORY_CARE_PROVIDER_SITE_OTHER): Payer: 59 | Admitting: Medical

## 2018-07-09 ENCOUNTER — Encounter: Payer: Self-pay | Admitting: Medical

## 2018-07-09 VITALS — BP 145/95 | HR 81 | Temp 97.7°F | Resp 16 | Ht 73.0 in | Wt 297.2 lb

## 2018-07-09 DIAGNOSIS — Z0189 Encounter for other specified special examinations: Secondary | ICD-10-CM | POA: Diagnosis not present

## 2018-07-09 DIAGNOSIS — Z0000 Encounter for general adult medical examination without abnormal findings: Secondary | ICD-10-CM | POA: Diagnosis not present

## 2018-07-09 LAB — POC URINALSYSI DIPSTICK (AUTOMATED)
BILIRUBIN UA: NEGATIVE
Blood, UA: NEGATIVE
GLUCOSE UA: NEGATIVE
Ketones, UA: NEGATIVE
Leukocytes, UA: NEGATIVE
NITRITE UA: NEGATIVE
Protein, UA: NEGATIVE
Spec Grav, UA: 1.015 (ref 1.010–1.025)
Urobilinogen, UA: 0.2 E.U./dL
pH, UA: 6 (ref 5.0–8.0)

## 2018-07-09 LAB — LIPID PANEL
CHOL/HDL RATIO: 4
Cholesterol: 168 mg/dL (ref 0–200)
HDL: 42.1 mg/dL (ref >39.00)
LDL Cholesterol: 105 mg/dL — ABNORMAL HIGH (ref 0–99)
NonHDL: 126.29
TRIGLYCERIDES: 104 mg/dL (ref 0.0–149.0)
VLDL: 20.8 mg/dL (ref 0.0–40.0)

## 2018-07-09 LAB — COMPREHENSIVE METABOLIC PANEL
ALBUMIN: 4.3 g/dL (ref 3.5–5.2)
ALT: 18 U/L (ref 0–53)
AST: 18 U/L (ref 0–37)
Alkaline Phosphatase: 58 U/L (ref 39–117)
BILIRUBIN TOTAL: 0.7 mg/dL (ref 0.2–1.2)
BUN: 14 mg/dL (ref 6–23)
CALCIUM: 9.5 mg/dL (ref 8.4–10.5)
CO2: 27 meq/L (ref 19–32)
CREATININE: 1.1 mg/dL (ref 0.40–1.50)
Chloride: 104 mEq/L (ref 96–112)
GFR: 80.92 mL/min (ref >60.00)
Glucose, Bld: 100 mg/dL — ABNORMAL HIGH (ref 70–99)
Potassium: 4.2 mEq/L (ref 3.5–5.1)
Sodium: 138 mEq/L (ref 135–145)
Total Protein: 7.2 g/dL (ref 6.0–8.3)

## 2018-07-09 LAB — CBC WITH DIFFERENTIAL/PLATELET
BASOS ABS: 0.1 10*3/uL (ref 0.0–0.1)
Basophils Relative: 1 % (ref 0.0–3.0)
EOS ABS: 0.4 10*3/uL (ref 0.0–0.7)
Eosinophils Relative: 5.7 % — ABNORMAL HIGH (ref 0.0–5.0)
HEMATOCRIT: 43.5 % (ref 39.0–52.0)
HEMOGLOBIN: 14.6 g/dL (ref 13.0–17.0)
LYMPHS PCT: 26.4 % (ref 12.0–46.0)
Lymphs Abs: 2 10*3/uL (ref 0.7–4.0)
MCHC: 33.6 g/dL (ref 30.0–36.0)
MCV: 87.3 fl (ref 78.0–100.0)
Monocytes Absolute: 0.6 10*3/uL (ref 0.1–1.0)
Monocytes Relative: 7.3 % (ref 3.0–12.0)
Neutro Abs: 4.6 10*3/uL (ref 1.4–7.7)
Neutrophils Relative %: 59.6 % (ref 43.0–77.0)
PLATELETS: 242 10*3/uL (ref 150.0–400.0)
RBC: 4.98 Mil/uL (ref 4.22–5.81)
RDW: 12.7 % (ref 11.5–15.5)
WBC: 7.8 10*3/uL (ref 4.0–10.5)

## 2018-07-09 MED ORDER — LOSARTAN POTASSIUM 25 MG PO TABS: 25.0000 mg | ORAL_TABLET | Freq: Every day | ORAL | 3 refills | Status: DC

## 2018-07-09 NOTE — Patient Instructions (Addendum)
For you wellness exam today I have ordered cbc, cmp, lipid panel, and UA.  Vaccine up to date.(deferred flu vaccine presently but if change you mind let us know)  Recommend exercise and healthy diet.  We will let you know lab results as they come in.  Follow up date appointment will be determined after lab review.   For htn will rx losartan low dose 25 mg. Diet and exercise as well. Check bp if not consistently less than 140/90 then will increase dose. Update in 2 weeks how bp area doing.   Preventive Care 18-39 Years, Male Preventive care refers to lifestyle choices and visits with your health care provider that can promote health and wellness. What does preventive care include?  A yearly physical exam. This is also called an annual well check.  Dental exams once or twice a year.  Routine eye exams. Ask your health care provider how often you should have your eyes checked.  Personal lifestyle choices, including: ? Daily care of your teeth and gums. ? Regular physical activity. ? Eating a healthy diet. ? Avoiding tobacco and drug use. ? Limiting alcohol use. ? Practicing safe sex. What happens during an annual well check? The services and screenings done by your health care provider during your annual well check will depend on your age, overall health, lifestyle risk factors, and family history of disease. Counseling Your health care provider may ask you questions about your:  Alcohol use.  Tobacco use.  Drug use.  Emotional well-being.  Home and relationship well-being.  Sexual activity.  Eating habits.  Work and work Statistician.  Screening You may have the following tests or measurements:  Height, weight, and BMI.  Blood pressure.  Lipid and cholesterol levels. These may be checked every 5 years starting at age 2.  Diabetes screening. This is done by checking your blood sugar (glucose) after you have not eaten for a while (fasting).  Skin  check.  Hepatitis C blood test.  Hepatitis B blood test.  Sexually transmitted disease (STD) testing.  Discuss your test results, treatment options, and if necessary, the need for more tests with your health care provider. Vaccines Your health care provider may recommend certain vaccines, such as:  Influenza vaccine. This is recommended every year.  Tetanus, diphtheria, and acellular pertussis (Tdap, Td) vaccine. You may need a Td booster every 10 years.  Varicella vaccine. You may need this if you have not been vaccinated.  HPV vaccine. If you are 68 or younger, you may need three doses over 6 months.  Measles, mumps, and rubella (MMR) vaccine. You may need at least one dose of MMR.You may also need a second dose.  Pneumococcal 13-valent conjugate (PCV13) vaccine. You may need this if you have certain conditions and have not been vaccinated.  Pneumococcal polysaccharide (PPSV23) vaccine. You may need one or two doses if you smoke cigarettes or if you have certain conditions.  Meningococcal vaccine. One dose is recommended if you are age 29-21 years and a first-year college student living in a residence hall, or if you have one of several medical conditions. You may also need additional booster doses.  Hepatitis A vaccine. You may need this if you have certain conditions or if you travel or work in places where you may be exposed to hepatitis A.  Hepatitis B vaccine. You may need this if you have certain conditions or if you travel or work in places where you may be exposed to hepatitis B.  Haemophilus influenzae type b (Hib) vaccine. You may need this if you have certain risk factors.  Talk to your health care provider about which screenings and vaccines you need and how often you need them. This information is not intended to replace advice given to you by your health care provider. Make sure you discuss any questions you have with your health care provider. Document Released:  12/23/2001 Document Revised: 07/16/2016 Document Reviewed: 08/28/2015 Elsevier Interactive Patient Education  [employer].

## 2018-07-09 NOTE — Progress Notes (Signed)
Subjective:    Patient ID: Greg Santos, male    DOB: 01-17-1981, 37 y.o.   MRN: [id number]  HPI  Pt in for CPE. He is fasting  Wife pregnant due very soon mid September. 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 States eats out a lot a times with traveling for work. Admits not eating healthy presenty. Married and has one child 21 years old.   Pt did not get flu vaccine last year.  Pt does not smoke.  Pt request nicotine screening lab. Just in case requirement for his work.  Pt bp has been high in the past and high recently as well. Pt checked his bp recently and was 140/90. Today high as well.   Review of Systems  Constitutional: Negative for chills, fatigue and fever.  HENT: Negative for congestion, dental problem, postnasal drip and rhinorrhea.   Respiratory: Negative for cough, chest tightness, shortness of breath and wheezing.   Cardiovascular: Negative for chest pain and palpitations.  Gastrointestinal: Negative for abdominal pain, blood in stool and constipation.  Musculoskeletal: Negative for back pain.  Skin: Negative for rash.  Neurological: Negative for dizziness, speech difficulty, light-headedness and headaches.  Hematological: Negative for adenopathy. Does not bruise/bleed easily.  Psychiatric/Behavioral: Negative for behavioral problems, confusion and sleep disturbance. The patient is not nervous/anxious.     Past Medical History:  Diagnosis Date  . ADD (attention deficit disorder)   . Hypertension   . Obesity      Social History   Socioeconomic History  . Marital status: Married    Spouse name: Not on file  . Number of children: Not on file  . Years of education: Not on file  . Highest education level: Not on file  Occupational History  . Not on file  Social Needs  . Financial resource strain: Not on file  . Food insecurity:    Worry: Not on file    Inability: Not on file  . Transportation needs:    Medical: Not on file   Non-medical: Not on file  Tobacco Use  . Smoking status: Never Smoker  . Smokeless tobacco: Never Used  Substance and Sexual Activity  . Alcohol use: Yes    Alcohol/week: 1.0 standard drinks    Types: 1 Cans of beer per week    Comment: once a week   . Drug use: No  . Sexual activity: Not on file  Lifestyle  . Physical activity:    Days per week: Not on file    Minutes per session: Not on file  . Stress: Not on file  Relationships  . Social connections:    Talks on phone: Not on file    Gets together: Not on file    Attends religious service: Not on file    Active member of club or organization: Not on file    Attends meetings of clubs or organizations: Not on file    Relationship status: Not on file  . Intimate partner violence:    Fear of current or ex partner: Not on file    Emotionally abused: Not on file    Physically abused: Not on file    Forced sexual activity: Not on file  Other Topics Concern  . Not on file  Social History Narrative  . Not on file    Past Surgical History:  Procedure Laterality Date  . ESOPHAGOGASTRODUODENOSCOPY ENDOSCOPY N/A 2009    Family History  Problem Relation Age of Onset  .  Coronary artery disease Unknown        first degree relative  . Coronary artery disease Father   . Stroke Paternal Grandmother   . Coronary artery disease Paternal Grandfather   . Heart attack Paternal Grandfather     Allergies  Allergen Reactions  . Tetracycline Rash    REACTION: Rash/Swelling    Current Outpatient Medications on File Prior to Visit  Medication Sig Dispense Refill  . buPROPion (WELLBUTRIN XL) 150 MG 24 hr tablet Take 1 tablet (150 mg total) by mouth every morning. 90 tablet 3  . Loratadine (ALLERGY RELIEF REDI-TABS PO) Take 1 tablet by mouth daily as needed.      . Multiple Vitamins-Minerals (MENS MULTIVITAMIN PLUS) TABS Take by mouth daily.    Marland Kitchen. omeprazole (PRILOSEC) 20 MG capsule Take 1 capsule (20 mg total) by mouth daily. 90  capsule 3   No current facility-administered medications on file prior to visit.     BP (!) 159/102   Pulse 81   Temp 97.7 F (36.5 C) (Oral)   Resp 16   Ht 6\' 1"  (1.854 m)   Wt 297 lb 3.2 oz (134.8 kg)   SpO2 98%   BMI 39.21 kg/m       Objective:   Physical Exam  General Mental Status- Alert. General Appearance- Not in acute distress.   Skin General: Color- Normal Color. Moisture- Normal Moisture. Only small scattered freckles. No worrisome moles.  Neck Carotid Arteries- Normal color. Moisture- Normal Moisture. No carotid bruits. No JVD.  Chest and Lung Exam Auscultation: Breath Sounds:-Normal.  Cardiovascular Auscultation:Rythm- Regular. Murmurs & Other Heart Sounds:Auscultation of the heart reveals- No Murmurs.  Abdomen Inspection:-Inspeection Normal. Palpation/Percussion:Note:No mass. Palpation and Percussion of the abdomen reveal- Non Tender, Non Distended + BS, no rebound or guarding.   Neurologic Cranial Nerve exam:- CN III-XII intact(No nystagmus), symmetric smile. Strength:- 5/5 equal and symmetric strength both upper and lower extremities.  Genital exam- deferred.     Assessment & Plan:  For you wellness exam today I have ordered cbc, cmp, lipid panel, and UA.  Vaccine up to date.  Recommend exercise and healthy diet.  We will let you know lab results as they come in.  Follow up date appointment will be determined after lab review.

## 2018-07-13 LAB — NICOTINE AND COTININE LC/MS/MS, U
COTININE, URINE: <2 ng/mL
NICOTINE, URINE: <2 ng/mL

## 2018-09-06 ENCOUNTER — Other Ambulatory Visit: Payer: Self-pay | Admitting: Medical

## 2018-09-06 MED ORDER — LOSARTAN POTASSIUM 25 MG PO TABS: 25.0000 mg | ORAL_TABLET | Freq: Every day | ORAL | 1 refills | Status: DC

## 2018-09-06 NOTE — Telephone Encounter (Signed)
Copied from CRM [id number]. Topic: Quick Communication - Rx Refill/Question >> Sep 06, 2018  9:49 AM Percival Spanish wrote: Medication losartan (COZAAR) 25 MG tablet       pt req 90 day supply   Preferred Pharmacy   [facility]   [address]   Agent: Please be advised that RX refills may take up to 3 business days. We ask that you follow-up with your pharmacy.

## 2019-03-04 ENCOUNTER — Ambulatory Visit (INDEPENDENT_AMBULATORY_CARE_PROVIDER_SITE_OTHER): Payer: 59 | Admitting: Medical

## 2019-03-04 ENCOUNTER — Encounter: Payer: Self-pay | Admitting: Medical

## 2019-03-04 VITALS — BP 130/90 | HR 93 | Wt 267.0 lb

## 2019-03-04 DIAGNOSIS — F988 Other specified behavioral and emotional disorders with onset usually occurring in childhood and adolescence: Secondary | ICD-10-CM

## 2019-03-04 DIAGNOSIS — I10 Essential (primary) hypertension: Secondary | ICD-10-CM | POA: Diagnosis not present

## 2019-03-04 DIAGNOSIS — K219 Gastro-esophageal reflux disease without esophagitis: Secondary | ICD-10-CM | POA: Diagnosis not present

## 2019-03-04 MED ORDER — LOSARTAN POTASSIUM 25 MG PO TABS: 25.0000 mg | ORAL_TABLET | Freq: Every day | ORAL | 1 refills | Status: DC

## 2019-03-04 NOTE — Progress Notes (Signed)
   Subjective:    Patient ID: Greg Santos, male    DOB: 1981/09/11, 38 y.o.   MRN: [id number]  HPI  Virtual Visit via Video Note  I connected with Greg Santos on 03/04/19 at 10:40 AM EDT by a video enabled telemedicine application and verified that I am speaking with the correct person using two identifiers.   I discussed the limitations of evaluation and management by telemedicine and the availability of in person appointments. The patient expressed understanding and agreed to proceed.    History of Present Illness:   Pt just checked today was 130/90. Pt states his blood pressure was higher. Pt states he lost 35 lbs with keto diet. Pt states he was exercising more before pandemic. He is walking every day after work.  Pt not smoker.  Pt is using wellbutrin for ADD. It is helping per pt.  Pt reflux is controlled. He states eating better.  Pt allergies are under control. Had for 2 weeks with allergies but now controlled.    Observations/Objective: No acute distress.   Assessment and Plan: Bp is better than past. Med and weight loss has helped. Refill med today.  ADD conttrolled. Continue with wellbutrin.  For gerd, controlled with omeprzole. Doing very well. Since eating better and no symptoms can try to dc.  Follow up august for CPE/wellness.  Follow Up Instructions:    I discussed the assessment and treatment plan with the patient. The patient was provided an opportunity to ask questions and all were answered. The patient agreed with the plan and demonstrated an understanding of the instructions.   The patient was advised to call back or seek an in-person evaluation if the symptoms worsen or if the condition fails to improve as anticipated.     Esperanza Richters, PA-C    Review of Systems negtive    Objective:   Physical Exam  No acute distess      Assessment & Plan:

## 2019-03-04 NOTE — Patient Instructions (Signed)
Bp is better than past. Med and weight loss has helped. Refill med today.  ADD conttrolled. Continue with wellbutrin.  For gerd, controlled with omeprzole. Doing very well. Since eating better and no symptoms can try to dc.  Follow up august for CPE/wellness.

## 2019-04-29 ENCOUNTER — Encounter: Payer: Self-pay | Admitting: Medical

## 2019-04-29 DIAGNOSIS — F900 Attention-deficit hyperactivity disorder, predominantly inattentive type: Secondary | ICD-10-CM

## 2019-04-29 MED ORDER — OMEPRAZOLE 20 MG PO CPDR: 20.0000 mg | DELAYED_RELEASE_CAPSULE | Freq: Every day | ORAL | 3 refills | Status: DC

## 2019-04-29 MED ORDER — BUPROPION HCL ER (XL) 150 MG PO TB24: 150.0000 mg | ORAL_TABLET | Freq: Every morning | ORAL | 3 refills | Status: DC

## 2019-07-07 ENCOUNTER — Telehealth: Payer: Self-pay | Admitting: Medical

## 2019-07-07 NOTE — Telephone Encounter (Signed)
LVM for pt to change appt on 08-31, provider will not be in office in the morning needing to change appt to PM or another day at pt convenience. JMS

## 2019-07-11 ENCOUNTER — Encounter: Payer: 59 | Admitting: Medical

## 2019-08-02 ENCOUNTER — Other Ambulatory Visit: Payer: Self-pay

## 2019-08-03 ENCOUNTER — Encounter: Payer: Self-pay | Admitting: Medical

## 2019-08-03 ENCOUNTER — Other Ambulatory Visit: Payer: Self-pay

## 2019-08-03 ENCOUNTER — Ambulatory Visit (INDEPENDENT_AMBULATORY_CARE_PROVIDER_SITE_OTHER): Payer: 59 | Admitting: Medical

## 2019-08-03 VITALS — BP 135/96 | HR 84 | Temp 97.5°F | Resp 16 | Ht 73.0 in | Wt 280.4 lb

## 2019-08-03 DIAGNOSIS — Z0000 Encounter for general adult medical examination without abnormal findings: Secondary | ICD-10-CM | POA: Diagnosis not present

## 2019-08-03 DIAGNOSIS — Z0189 Encounter for other specified special examinations: Secondary | ICD-10-CM | POA: Diagnosis not present

## 2019-08-03 LAB — COMPREHENSIVE METABOLIC PANEL
ALT: 32 U/L (ref 0–53)
AST: 26 U/L (ref 0–37)
Albumin: 4.5 g/dL (ref 3.5–5.2)
Alkaline Phosphatase: 53 U/L (ref 39–117)
BUN: 13 mg/dL (ref 6–23)
CO2: 28 mEq/L (ref 19–32)
Calcium: 9.9 mg/dL (ref 8.4–10.5)
Chloride: 101 mEq/L (ref 96–112)
Creatinine, Ser: 0.99 mg/dL (ref 0.40–1.50)
GFR: 85.45 mL/min (ref >60.00)
Glucose, Bld: 87 mg/dL (ref 70–99)
Potassium: 4.1 mEq/L (ref 3.5–5.1)
Sodium: 138 mEq/L (ref 135–145)
Total Bilirubin: 1 mg/dL (ref 0.2–1.2)
Total Protein: 7.3 g/dL (ref 6.0–8.3)

## 2019-08-03 LAB — CBC WITH DIFFERENTIAL/PLATELET
Basophils Absolute: 0 10*3/uL (ref 0.0–0.1)
Basophils Relative: 0.6 % (ref 0.0–3.0)
Eosinophils Absolute: 0.2 10*3/uL (ref 0.0–0.7)
Eosinophils Relative: 3.2 % (ref 0.0–5.0)
HCT: 43.6 % (ref 39.0–52.0)
Hemoglobin: 14.4 g/dL (ref 13.0–17.0)
Lymphocytes Relative: 24.1 % (ref 12.0–46.0)
Lymphs Abs: 1.7 10*3/uL (ref 0.7–4.0)
MCHC: 32.9 g/dL (ref 30.0–36.0)
MCV: 89.4 fl (ref 78.0–100.0)
Monocytes Absolute: 0.5 10*3/uL (ref 0.1–1.0)
Monocytes Relative: 7.5 % (ref 3.0–12.0)
Neutro Abs: 4.6 10*3/uL (ref 1.4–7.7)
Neutrophils Relative %: 64.6 % (ref 43.0–77.0)
Platelets: 226 10*3/uL (ref 150.0–400.0)
RBC: 4.88 Mil/uL (ref 4.22–5.81)
RDW: 12.8 % (ref 11.5–15.5)
WBC: 7.2 10*3/uL (ref 4.0–10.5)

## 2019-08-03 LAB — LIPID PANEL
Cholesterol: 183 mg/dL (ref 0–200)
HDL: 44.6 mg/dL (ref >39.00)
LDL Cholesterol: 111 mg/dL — ABNORMAL HIGH (ref 0–99)
NonHDL: 138.16
Total CHOL/HDL Ratio: 4
Triglycerides: 134 mg/dL (ref 0.0–149.0)
VLDL: 26.8 mg/dL (ref 0.0–40.0)

## 2019-08-03 NOTE — Progress Notes (Addendum)
Subjective:    Patient ID: Greg Santos, male    DOB: 02-08-1981, 38 y.o.   MRN: [id number]  HPI  Pt in for CPE. 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 States eats out a lot a times with traveling for work. Admits not eating healthy presenty. Married and has one child 42 years old.   Pt will get flu vaccine this year.  Pt does not smoke.  Pt request nicotine screening lab. Just in case requirement for his work.  Pt bp has been high in the past and high recently as well. Pt checked his bp recently and was 130/90 consistently at home. Today high as well on diastolic.  He states eating more and staying more at home.   Review of Systems  Constitutional: Negative for chills, fatigue and fever.  HENT: Negative for congestion, ear discharge, ear pain, nosebleeds, postnasal drip, sinus pressure and sinus pain.   Respiratory: Negative for cough, chest tightness, shortness of breath and wheezing.   Cardiovascular: Negative for chest pain and palpitations.  Gastrointestinal: Negative for abdominal pain.  Musculoskeletal: Negative for back pain.  Skin: Negative for rash.  Neurological: Negative for dizziness, tremors, speech difficulty, light-headedness and headaches.  Hematological: Negative for adenopathy. Does not bruise/bleed easily.  Psychiatric/Behavioral: Negative for agitation, behavioral problems and confusion.    Past Medical History:  Diagnosis Date  . ADD (attention deficit disorder)   . Hypertension   . Obesity      Social History   Socioeconomic History  . Marital status: Married    Spouse name: Not on file  . Number of children: Not on file  . Years of education: Not on file  . Highest education level: Not on file  Occupational History  . Not on file  Social Needs  . Financial resource strain: Not on file  . Food insecurity    Worry: Not on file    Inability: Not on file  . Transportation needs    Medical: Not on  file    Non-medical: Not on file  Tobacco Use  . Smoking status: Never Smoker  . Smokeless tobacco: Never Used  Substance and Sexual Activity  . Alcohol use: Yes    Alcohol/week: 1.0 standard drinks    Types: 1 Cans of beer per week    Comment: once a week   . Drug use: No  . Sexual activity: Not on file  Lifestyle  . Physical activity    Days per week: Not on file    Minutes per session: Not on file  . Stress: Not on file  Relationships  . Social Herbalist on phone: Not on file    Gets together: Not on file    Attends religious service: Not on file    Active member of club or organization: Not on file    Attends meetings of clubs or organizations: Not on file    Relationship status: Not on file  . Intimate partner violence    Fear of current or ex partner: Not on file    Emotionally abused: Not on file    Physically abused: Not on file    Forced sexual activity: Not on file  Other Topics Concern  . Not on file  Social History Narrative  . Not on file    Past Surgical History:  Procedure Laterality Date  . ESOPHAGOGASTRODUODENOSCOPY ENDOSCOPY N/A 2009    Family History  Problem  Relation Age of Onset  . Coronary artery disease Unknown        first degree relative  . Coronary artery disease Father   . Stroke Paternal Grandmother   . Coronary artery disease Paternal Grandfather   . Heart attack Paternal Grandfather     Allergies  Allergen Reactions  . Tetracycline Rash    REACTION: Rash/Swelling    Current Outpatient Medications on File Prior to Visit  Medication Sig Dispense Refill  . buPROPion (WELLBUTRIN XL) 150 MG 24 hr tablet Take 1 tablet (150 mg total) by mouth every morning. 90 tablet 3  . Loratadine (ALLERGY RELIEF REDI-TABS PO) Take 1 tablet by mouth daily as needed.      Marland Kitchen losartan (COZAAR) 25 MG tablet Take 1 tablet (25 mg total) by mouth daily. 90 tablet 1  . Multiple Vitamins-Minerals (MENS MULTIVITAMIN PLUS) TABS Take by mouth  daily.    Marland Kitchen omeprazole (PRILOSEC) 20 MG capsule Take 1 capsule (20 mg total) by mouth daily. 90 capsule 3   No current facility-administered medications on file prior to visit.     BP (!) 139/98   Pulse 84   Temp (!) 97.5 F (36.4 C) (Temporal)   Resp 16   Ht 6\' 1"  (1.854 m)   Wt 280 lb 6.4 oz (127.2 kg)   SpO2 100%   BMI 36.99 kg/m       Objective:   Physical Exam  General Mental Status- Alert. General Appearance- Not in acute distress.   Skin General: Color- Normal Color. Moisture- Normal Moisture.  Small scattered freckles. No worrisome lesions/moles.  Neck Carotid Arteries- Normal color. Moisture- Normal Moisture. No carotid bruits.   Chest and Lung Exam Auscultation: Breath Sounds:-Normal.  Cardiovascular Auscultation:Rythm- Regular. Murmurs & Other Heart Sounds:Auscultation of the heart reveals- No Murmurs.  Abdomen Inspection:-Inspeection Normal. Palpation/Percussion:Note:No mass. Palpation and Percussion of the abdomen reveal- Non Tender, Non Distended + BS, no rebound or guarding.   Neurologic Cranial Nerve exam:- CN III-XII intact(No nystagmus), symmetric smile. Strength:- 5/5 equal and symmetric strength both upper and lower extremities.      Assessment & Plan:  For you wellness exam today I have ordered cbc, cmp, tsh and lipid panel. Nicotine/cotinine test.  Flu vaccine flu vaccine in October at [facility].  Recommend exercise and healthy diet.  We will let you know lab results as they come in.  Follow up date appointment will be determined after lab review.   For htn increase losartan to 50 mg daily/double up on current 25 mg tabs. Recommend exericse, healthy diet and weight loss.  Follow up date to be determined after lab review.

## 2019-08-03 NOTE — Addendum Note (Signed)
Addended by: Kelle Darting A on: 08/03/2019 08:52 AM   Modules accepted: Orders

## 2019-08-03 NOTE — Addendum Note (Signed)
Addended by: Kelle Darting A on: 08/03/2019 08:44 AM   Modules accepted: Orders

## 2019-08-03 NOTE — Patient Instructions (Addendum)
For you wellness exam today I have ordered cbc, cmp and lipid panel. Nicotine/cotinine test as well.  Pt states will get flu vaccine in October at [facility].  Recommend exercise and healthy diet.  We will let you know lab results as they come in.  Follow up date appointment will be determined after lab review.   For htn increase losartan to 50 mg daily/double up on current 25 mg tabs. Recommend exericse, healthy diet and weight loss.  Follow up date to be determined after lab review.   Preventive Care 65-76 Years Old, Male Preventive care refers to lifestyle choices and visits with your health care provider that can promote health and wellness. This includes:  A yearly physical exam. This is also called an annual well check.  Regular dental and eye exams.  Immunizations.  Screening for certain conditions.  Healthy lifestyle choices, such as eating a healthy diet, getting regular exercise, not using drugs or products that contain nicotine and tobacco, and limiting alcohol use. What can I expect for my preventive care visit? Physical exam Your health care provider will check:  Height and weight. These may be used to calculate body mass index (BMI), which is a measurement that tells if you are at a healthy weight.  Heart rate and blood pressure.  Your skin for abnormal spots. Counseling Your health care provider may ask you questions about:  Alcohol, tobacco, and drug use.  Emotional well-being.  Home and relationship well-being.  Sexual activity.  Eating habits.  Work and work Statistician. What immunizations do I need?  Influenza (flu) vaccine  This is recommended every year. Tetanus, diphtheria, and pertussis (Tdap) vaccine  You may need a Td booster every 10 years. Varicella (chickenpox) vaccine  You may need this vaccine if you have not already been vaccinated. Human papillomavirus (HPV) vaccine  If recommended by your health care provider, you  may need three doses over 6 months. Measles, mumps, and rubella (MMR) vaccine  You may need at least one dose of MMR. You may also need a second dose. Meningococcal conjugate (MenACWY) vaccine  One dose is recommended if you are 19-60 years old and a Market researcher living in a residence hall, or if you have one of several medical conditions. You may also need additional booster doses. Pneumococcal conjugate (PCV13) vaccine  You may need this if you have certain conditions and were not previously vaccinated. Pneumococcal polysaccharide (PPSV23) vaccine  You may need one or two doses if you smoke cigarettes or if you have certain conditions. Hepatitis A vaccine  You may need this if you have certain conditions or if you travel or work in places where you may be exposed to hepatitis A. Hepatitis B vaccine  You may need this if you have certain conditions or if you travel or work in places where you may be exposed to hepatitis B. Haemophilus influenzae type b (Hib) vaccine  You may need this if you have certain risk factors. You may receive vaccines as individual doses or as more than one vaccine together in one shot (combination vaccines). Talk with your health care provider about the risks and benefits of combination vaccines. What tests do I need? Blood tests  Lipid and cholesterol levels. These may be checked every 5 years starting at age 67.  Hepatitis C test.  Hepatitis B test. Screening   Diabetes screening. This is done by checking your blood sugar (glucose) after you have not eaten for a while (fasting).  Sexually transmitted disease (STD) testing. Talk with your health care provider about your test results, treatment options, and if necessary, the need for more tests. Follow these instructions at home: Eating and drinking   Eat a diet that includes fresh fruits and vegetables, whole grains, lean protein, and low-fat dairy products.  Take vitamin and  mineral supplements as recommended by your health care provider.  Do not drink alcohol if your health care provider tells you not to drink.  If you drink alcohol: ? Limit how much you have to 0-2 drinks a day. ? Be aware of how much alcohol is in your drink. In the [place] one drink equals one 12 oz bottle of beer (355 mL), one 5 oz glass of wine (148 mL), or one 1 oz glass of hard liquor (44 mL). Lifestyle  Take daily care of your teeth and gums.  Stay active. Exercise for at least 30 minutes on 5 or more days each week.  Do not use any products that contain nicotine or tobacco, such as cigarettes, e-cigarettes, and chewing tobacco. If you need help quitting, ask your health care provider.  If you are sexually active, practice safe sex. Use a condom or other form of protection to prevent STIs (sexually transmitted infections). What's next?  Go to your health care provider once a year for a well check visit.  Ask your health care provider how often you should have your eyes and teeth checked.  Stay up to date on all vaccines. This information is not intended to replace advice given to you by your health care provider. Make sure you discuss any questions you have with your health care provider. Document Released: 12/23/2001 Document Revised: 10/21/2018 Document Reviewed: 10/21/2018 Elsevier Patient Education  2020 [employer].

## 2019-08-07 LAB — NICOTINE AND COTININE LC/MS/MS, U
COTININE, URINE: <2 ng/mL
NICOTINE, URINE: <2 ng/mL

## 2019-08-10 ENCOUNTER — Telehealth: Payer: Self-pay

## 2019-08-10 NOTE — Telephone Encounter (Signed)
Form mailed to [place].

## 2019-08-10 NOTE — Telephone Encounter (Signed)
Copied from [place] [id number]. Topic: General - Other >> Aug 09, 2019  3:17 PM Pauline Good wrote: Reason for CRM: pt called and stated he want his forms mailed to him instead of him picking them up.

## 2019-08-16 ENCOUNTER — Telehealth: Payer: Self-pay | Admitting: Medical

## 2019-08-16 ENCOUNTER — Encounter: Payer: Self-pay | Admitting: Medical

## 2019-08-16 MED ORDER — LOSARTAN POTASSIUM 50 MG PO TABS: 50.0000 mg | ORAL_TABLET | Freq: Every day | ORAL | 3 refills | Status: DC

## 2019-08-16 NOTE — Telephone Encounter (Signed)
I went ahead and sent in rx losartan 50 mg dose.

## 2019-08-16 NOTE — Telephone Encounter (Signed)
Okay to send Losartan 50mg ?

## 2019-08-29 NOTE — Telephone Encounter (Signed)
Patient called in stating he would like a copy mailed to his address, not benefits department. Please advise.

## 2019-08-30 NOTE — Telephone Encounter (Signed)
Mailed copy to pt. Notified pt.

## 2020-05-03 ENCOUNTER — Other Ambulatory Visit: Payer: Self-pay | Admitting: Medical

## 2020-05-18 ENCOUNTER — Telehealth: Payer: Self-pay | Admitting: Medical

## 2020-05-18 DIAGNOSIS — F900 Attention-deficit hyperactivity disorder, predominantly inattentive type: Secondary | ICD-10-CM

## 2020-05-18 MED ORDER — BUPROPION HCL ER (XL) 150 MG PO TB24: 150.0000 mg | ORAL_TABLET | Freq: Every morning | ORAL | 3 refills | Status: DC

## 2020-05-18 NOTE — Telephone Encounter (Signed)
RX SENT

## 2020-05-18 NOTE — Telephone Encounter (Signed)
Medication: buPROPion (WELLBUTRIN XL) 150 MG 24 hr tablet [[id number]]    Has the patient contacted their pharmacy? No. (If no, request that the patient contact the pharmacy for the refill.) (If yes, when and what did the pharmacy advise?)  Preferred Pharmacy (with phone number or street name): [facility] - [place], [place] - [address]  [address] [place] [address]  Phone:  [phone] Fax:  [phone]  DEA #:  --  Agent: Please be advised that RX refills may take up to 3 business days. We ask that you follow-up with your pharmacy.

## 2020-08-03 ENCOUNTER — Encounter: Payer: 59 | Admitting: Medical

## 2020-08-14 ENCOUNTER — Ambulatory Visit (INDEPENDENT_AMBULATORY_CARE_PROVIDER_SITE_OTHER): Payer: 59 | Admitting: Medical

## 2020-08-14 ENCOUNTER — Encounter: Payer: Self-pay | Admitting: Medical

## 2020-08-14 ENCOUNTER — Other Ambulatory Visit: Payer: Self-pay

## 2020-08-14 VITALS — BP 128/83 | HR 93 | Resp 18 | Ht 72.0 in | Wt 295.3 lb

## 2020-08-14 DIAGNOSIS — Z0189 Encounter for other specified special examinations: Secondary | ICD-10-CM

## 2020-08-14 DIAGNOSIS — Z0000 Encounter for general adult medical examination without abnormal findings: Secondary | ICD-10-CM

## 2020-08-14 LAB — CBC WITH DIFFERENTIAL/PLATELET
Absolute Monocytes: 720 cells/uL (ref 200–950)
Basophils Absolute: 70 cells/uL (ref 0–200)
Basophils Relative: 0.7 %
Eosinophils Absolute: 0 cells/uL — ABNORMAL LOW (ref 15–500)
Eosinophils Relative: 0 %
HCT: 43.8 % (ref 38.5–50.0)
Hemoglobin: 14.9 g/dL (ref 13.2–17.1)
Lymphs Abs: 2100 cells/uL (ref 850–3900)
MCH: 30.2 pg (ref 27.0–33.0)
MCHC: 34 g/dL (ref 32.0–36.0)
MCV: 88.8 fL (ref 80.0–100.0)
MPV: 11.8 fL (ref 7.5–12.5)
Monocytes Relative: 7.2 %
Neutro Abs: 7110 cells/uL (ref 1500–7800)
Neutrophils Relative %: 71.1 %
Platelets: 257 10*3/uL (ref 140–400)
RBC: 4.93 10*6/uL (ref 4.20–5.80)
RDW: 11.9 % (ref 11.0–15.0)
Total Lymphocyte: 21 %
WBC: 10 10*3/uL (ref 3.8–10.8)

## 2020-08-14 LAB — COMPREHENSIVE METABOLIC PANEL
AG Ratio: 1.5 (calc) (ref 1.0–2.5)
ALT: 27 U/L (ref 9–46)
AST: 23 U/L (ref 10–40)
Albumin: 4.5 g/dL (ref 3.6–5.1)
Alkaline phosphatase (APISO): 62 U/L (ref 36–130)
BUN: 12 mg/dL (ref 7–25)
CO2: 28 mmol/L (ref 20–32)
Calcium: 9.8 mg/dL (ref 8.6–10.3)
Chloride: 103 mmol/L (ref 98–110)
Creat: 1.1 mg/dL (ref 0.60–1.35)
Globulin: 3 g/dL (calc) (ref 1.9–3.7)
Glucose, Bld: 88 mg/dL (ref 65–99)
Potassium: 4.5 mmol/L (ref 3.5–5.3)
Sodium: 139 mmol/L (ref 135–146)
Total Bilirubin: 1 mg/dL (ref 0.2–1.2)
Total Protein: 7.5 g/dL (ref 6.1–8.1)

## 2020-08-14 LAB — LIPID PANEL
Cholesterol: 205 mg/dL — ABNORMAL HIGH (ref <200)
HDL: 43 mg/dL (ref >=40)
LDL Cholesterol (Calc): 130 mg/dL (calc) — ABNORMAL HIGH
Non-HDL Cholesterol (Calc): 162 mg/dL (calc) — ABNORMAL HIGH (ref <130)
Total CHOL/HDL Ratio: 4.8 (calc) (ref <5.0)
Triglycerides: 187 mg/dL — ABNORMAL HIGH (ref <150)

## 2020-08-14 NOTE — Patient Instructions (Signed)
For you wellness exam today I have ordered cbc, cmp, and lipid panel. Nicotine/cotinine test.  Flu vaccine declined.  Recommend exercise and healthy diet.  We will let you know lab results as they come in.  For htn. Keep checking bp at home. If bp reading 140/90 or higher then would increase losartan to 100 mg daily.  Follow up date appointment will be determined after lab review.    Preventive Care 17-37 Years Old, Male Preventive care refers to lifestyle choices and visits with your health care provider that can promote health and wellness. This includes:  A yearly physical exam. This is also called an annual well check.  Regular dental and eye exams.  Immunizations.  Screening for certain conditions.  Healthy lifestyle choices, such as eating a healthy diet, getting regular exercise, not using drugs or products that contain nicotine and tobacco, and limiting alcohol use. What can I expect for my preventive care visit? Physical exam Your health care provider will check:  Height and weight. These may be used to calculate body mass index (BMI), which is a measurement that tells if you are at a healthy weight.  Heart rate and blood pressure.  Your skin for abnormal spots. Counseling Your health care provider may ask you questions about:  Alcohol, tobacco, and drug use.  Emotional well-being.  Home and relationship well-being.  Sexual activity.  Eating habits.  Work and work Statistician. What immunizations do I need?  Influenza (flu) vaccine  This is recommended every year. Tetanus, diphtheria, and pertussis (Tdap) vaccine  You may need a Td booster every 10 years. Varicella (chickenpox) vaccine  You may need this vaccine if you have not already been vaccinated. Human papillomavirus (HPV) vaccine  If recommended by your health care provider, you may need three doses over 6 months. Measles, mumps, and rubella (MMR) vaccine  You may need at least one  dose of MMR. You may also need a second dose. Meningococcal conjugate (MenACWY) vaccine  One dose is recommended if you are 11-27 years old and a Market researcher living in a residence hall, or if you have one of several medical conditions. You may also need additional booster doses. Pneumococcal conjugate (PCV13) vaccine  You may need this if you have certain conditions and were not previously vaccinated. Pneumococcal polysaccharide (PPSV23) vaccine  You may need one or two doses if you smoke cigarettes or if you have certain conditions. Hepatitis A vaccine  You may need this if you have certain conditions or if you travel or work in places where you may be exposed to hepatitis A. Hepatitis B vaccine  You may need this if you have certain conditions or if you travel or work in places where you may be exposed to hepatitis B. Haemophilus influenzae type b (Hib) vaccine  You may need this if you have certain risk factors. You may receive vaccines as individual doses or as more than one vaccine together in one shot (combination vaccines). Talk with your health care provider about the risks and benefits of combination vaccines. What tests do I need? Blood tests  Lipid and cholesterol levels. These may be checked every 5 years starting at age 41.  Hepatitis C test.  Hepatitis B test. Screening   Diabetes screening. This is done by checking your blood sugar (glucose) after you have not eaten for a while (fasting).  Sexually transmitted disease (STD) testing. Talk with your health care provider about your test results, treatment options, and if necessary,  the need for more tests. Follow these instructions at home: Eating and drinking   Eat a diet that includes fresh fruits and vegetables, whole grains, lean protein, and low-fat dairy products.  Take vitamin and mineral supplements as recommended by your health care provider.  Do not drink alcohol if your health care  provider tells you not to drink.  If you drink alcohol: ? Limit how much you have to 0-2 drinks a day. ? Be aware of how much alcohol is in your drink. In the [place] one drink equals one 12 oz bottle of beer (355 mL), one 5 oz glass of wine (148 mL), or one 1 oz glass of hard liquor (44 mL). Lifestyle  Take daily care of your teeth and gums.  Stay active. Exercise for at least 30 minutes on 5 or more days each week.  Do not use any products that contain nicotine or tobacco, such as cigarettes, e-cigarettes, and chewing tobacco. If you need help quitting, ask your health care provider.  If you are sexually active, practice safe sex. Use a condom or other form of protection to prevent STIs (sexually transmitted infections). What's next?  Go to your health care provider once a year for a well check visit.  Ask your health care provider how often you should have your eyes and teeth checked.  Stay up to date on all vaccines. This information is not intended to replace advice given to you by your health care provider. Make sure you discuss any questions you have with your health care provider. Document Revised: 10/21/2018 Document Reviewed: 10/21/2018 Elsevier Patient Education  2020 [employer].

## 2020-08-14 NOTE — Progress Notes (Signed)
   Subjective:    Patient ID: Greg Santos, male    DOB: 1981/08/19, 39 y.o.   MRN: [id number]  HPI Pt in for his wellness exam.  Pt works as Radio producer, Pthas not been exercising over past 2 months. States might be traveling for work in near future.Admits not eating healthy presenty when on the road. Married and has one child 93 years old.  Pt declines flu vaccine today.  Pt did get covid.   Pt does not smoke.  Pt request nicotine screening lab. Just in case requirement for his work.  Pt has hx of borderline bp in past. Pt gets 135/85 readings at home.     Review of Systems  Constitutional: Negative for chills, fatigue and fever.  Respiratory: Negative for cough, chest tightness, shortness of breath and wheezing.   Cardiovascular: Negative for chest pain and palpitations.  Gastrointestinal: Negative for abdominal pain, diarrhea, nausea and vomiting.  Genitourinary: Negative for flank pain and urgency.  Musculoskeletal: Negative for back pain.  Skin: Negative for rash.  Neurological: Negative for dizziness, weakness and light-headedness.  Hematological: Negative for adenopathy. Does not bruise/bleed easily.  Psychiatric/Behavioral: Negative for behavioral problems and dysphoric mood. The patient is not nervous/anxious and is not hyperactive.        Objective:   Physical Exam  General Mental Status- Alert. General Appearance- Not in acute distress.   Skin General: Color- Normal Color. Moisture- Normal Moisture.  Neck Carotid Arteries- Normal color. Moisture- Normal Moisture. No carotid bruits. No JVD.  Chest and Lung Exam Auscultation: Breath Sounds:-Normal.  Cardiovascular Auscultation:Rythm- Regular. Murmurs & Other Heart Sounds:Auscultation of the heart reveals- No Murmurs.  Abdomen Inspection:-Inspeection Normal. Palpation/Percussion:Note:No mass. Palpation and Percussion of the abdomen reveal- Non Tender, Non Distended + BS, no rebound or  guarding.    Neurologic Cranial Nerve exam:- CN III-XII intact(No nystagmus), symmetric smile. Strength:- 5/5 equal and symmetric strength both upper and lower extremities.      Assessment & Plan:  For you wellness exam today I have ordered cbc, cmp, and lipid panel. Nicotine/cotinine test.  Flu vaccine declined.  Recommend exercise and healthy diet.  We will let you know lab results as they come in.  For htn. Keep checking bp at home. If bp reading 140/90 or higher then would increase losartan to 100 mg daily.  Follow up date appointment will be determined after lab review.   Esperanza Richters, PA-C

## 2020-08-21 ENCOUNTER — Encounter: Payer: Self-pay | Admitting: Medical

## 2020-08-21 LAB — NICOTINE AND METABS, URINE
Cotinine: <10 ng/mL
Nicotine: <10 ng/mL

## 2020-11-06 ENCOUNTER — Other Ambulatory Visit: Payer: Self-pay | Admitting: Medical

## 2021-02-01 ENCOUNTER — Other Ambulatory Visit: Payer: Self-pay | Admitting: Medical

## 2021-05-06 ENCOUNTER — Other Ambulatory Visit: Payer: Self-pay | Admitting: Medical

## 2021-05-06 DIAGNOSIS — F900 Attention-deficit hyperactivity disorder, predominantly inattentive type: Secondary | ICD-10-CM

## 2021-08-14 ENCOUNTER — Other Ambulatory Visit: Payer: Self-pay | Admitting: Medical

## 2021-08-14 DIAGNOSIS — F900 Attention-deficit hyperactivity disorder, predominantly inattentive type: Secondary | ICD-10-CM

## 2021-09-11 ENCOUNTER — Other Ambulatory Visit: Payer: Self-pay | Admitting: Medical

## 2021-09-11 ENCOUNTER — Encounter: Payer: Self-pay | Admitting: Medical

## 2021-09-11 DIAGNOSIS — F900 Attention-deficit hyperactivity disorder, predominantly inattentive type: Secondary | ICD-10-CM

## 2021-09-11 MED ORDER — BUPROPION HCL ER (XL) 150 MG PO TB24: 150.0000 mg | ORAL_TABLET | Freq: Every morning | ORAL | 0 refills | Status: DC

## 2021-09-11 MED ORDER — OMEPRAZOLE 20 MG PO CPDR: 20.0000 mg | DELAYED_RELEASE_CAPSULE | Freq: Every day | ORAL | 0 refills | Status: DC

## 2021-09-11 MED ORDER — LOSARTAN POTASSIUM 50 MG PO TABS: 50.0000 mg | ORAL_TABLET | Freq: Every day | ORAL | 3 refills | Status: DC

## 2021-10-02 MED ORDER — OMEPRAZOLE 20 MG PO CPDR
20.0000 mg | DELAYED_RELEASE_CAPSULE | Freq: Every day | ORAL | 3 refills | Status: DC
Start: 2021-10-02 — End: 2022-10-06

## 2021-10-02 MED ORDER — BUPROPION HCL ER (XL) 150 MG PO TB24: 150.0000 mg | ORAL_TABLET | Freq: Every morning | ORAL | 3 refills | Status: DC

## 2021-10-02 NOTE — Addendum Note (Signed)
Addended by: Maximino Sarin on: 10/02/2021 10:49 AM   Modules accepted: Orders

## 2022-01-29 ENCOUNTER — Encounter: Payer: Self-pay | Admitting: Medical

## 2022-01-29 ENCOUNTER — Telehealth: Payer: 59 | Admitting: Medical

## 2022-01-29 VITALS — BP 123/85 | HR 85

## 2022-01-29 DIAGNOSIS — F900 Attention-deficit hyperactivity disorder, predominantly inattentive type: Secondary | ICD-10-CM

## 2022-01-29 DIAGNOSIS — E785 Hyperlipidemia, unspecified: Secondary | ICD-10-CM

## 2022-01-29 DIAGNOSIS — I10 Essential (primary) hypertension: Secondary | ICD-10-CM

## 2022-01-29 DIAGNOSIS — K219 Gastro-esophageal reflux disease without esophagitis: Secondary | ICD-10-CM

## 2022-01-29 MED ORDER — LOSARTAN POTASSIUM 100 MG PO TABS: 100.0000 mg | ORAL_TABLET | Freq: Every day | ORAL | 3 refills | Status: DC

## 2022-01-29 NOTE — Patient Instructions (Addendum)
Hypertension-blood pressure well controlled today but was high when only on losartan 50 mg daily.  Since using 100 mg daily blood pressure well controlled.  I sent in losartan 100 mg prescription today. ? ?Hyperlipidemia-advised low-cholesterol diet and continue to exercise.  Placing future lipid panel and metabolic panel today.  Please get scheduled for that fasting early in the morning. ? ?Attention deficit well controlled with Wellbutrin. ? ?GERD-symptoms well controlled with omeprazole with only occasional flare when eating pizza or late at night.  Dad recently diagnosed with Barrett's.  You expressed concern and decided to recommend referral to GI for evaluation and treatment.  You might get repeat EGD as you had before.  Started to place referral but you decide you would set the referral up and try to get appointment in [place].  If you need a referral through PCP office please let me know.  Also recommend discussing dad's recent colon cancer diagnosis as well. ? ?Follow-up date to be determined after lab review. ?

## 2022-01-29 NOTE — Progress Notes (Signed)
? ?  Subjective:  ? ? Patient ID: Greg Santos, male    DOB: December 27, 1980, 41 y.o.   MRN: [id number] ? ?HPI ?Virtual Visit via Video Note ? ?I connected with Greg Santos on 01/29/22 at  8:20 AM EDT by a video enabled telemedicine application and verified that I am speaking with the correct person using two identifiers. ? ?Location: ?Patient: home ?Provider: office ? ?  ?I discussed the limitations of evaluation and management by telemedicine and the availability of in person appointments. The patient expressed understanding and agreed to proceed. ? ?History of Present Illness: ?Pt has htn. He states his bp was 140/100 with just 1 tab of losartan daily. So he started to take 2 tab daily and bp did decrease 120-130/85. Pt has been on 100 mg of losartan for one week now. ? ?Last lipid panel check showed elevated cholesterol ? ?Pt just started to exercise and cutting out fast foods. ? ?ADD-  well controlled/good concentration with wellbutin. ? ?Pt has screening colon cancer questions. Dad had colon cancer dx in 32's. Pt has no gi signs/symptoms. No blood in stools.  ? ?Pt has history of gerd. His dad has barretts on endoscopy. Pt is on omeprazole. 2008-2009 pt had trouble swallowing. He had esophageal dilation and he states saw some esophagitis. Taking ppi for years omeprazole 20 mg daily. He has some rare reflux symptoms if eats pizza or real late. ? ? ?  ?Observations/Objective: ? ?General-no acute distress, pleasant, oriented. ?Lungs- on inspection lungs appear unlabored. ?Neck- no tracheal deviation or jvd on inspection. ?Neuro- gross motor function appears intact.  ? ? ?Assessment and Plan: ?Patient Instructions  ?Hypertension-blood pressure well controlled today but was high when only on losartan 50 mg daily.  Since using 100 mg daily blood pressure well controlled.  I sent in losartan 100 mg prescription today. ? ?Hyperlipidemia-advised low-cholesterol diet and continue to exercise.  Placing future lipid  panel and metabolic panel today.  Please get scheduled for that fasting early in the morning. ? ?Attention deficit well controlled with Wellbutrin. ? ?GERD-symptoms well controlled with omeprazole with only occasional flare when eating pizza or late at night.  Dad recently diagnosed with Barrett's.  You expressed concern and decided to recommend referral to GI for evaluation and treatment.  You might get repeat EGD as you had before.  Started to place referral but you decide you would set the referral up and try to get appointment in [place].  If you need a referral through PCP office please let me know.  Also recommend discussing dad's recent colon cancer diagnosis as well. ? ?Follow-up date to be determined after lab review.  ? ? ?Mackie Pai, PA-C  ? ? ?Time spent with patient today was 24  minutes which consisted of chart revdiew, discussing diagnosis, work up treatment and documentation.  ? ?Follow Up Instructions: ? ?  ?I discussed the assessment and treatment plan with the patient. The patient was provided an opportunity to ask questions and all were answered. The patient agreed with the plan and demonstrated an understanding of the instructions. ?  ?The patient was advised to call back or seek an in-person evaluation if the symptoms worsen or if the condition fails to improve as anticipated. ? ? ? ? ?Mackie Pai, PA-C  ? ? ?Review of Systems ? ?   ?Objective:  ? Physical Exam ? ? ? ? ?   ?Assessment & Plan:  ? ? ?

## 2022-10-06 ENCOUNTER — Other Ambulatory Visit: Payer: Self-pay | Admitting: Medical

## 2022-10-06 DIAGNOSIS — F900 Attention-deficit hyperactivity disorder, predominantly inattentive type: Secondary | ICD-10-CM

## 2022-10-06 MED ORDER — OMEPRAZOLE 20 MG PO CPDR: 20.0000 mg | DELAYED_RELEASE_CAPSULE | Freq: Every day | ORAL | 0 refills | Status: DC

## 2022-10-06 MED ORDER — BUPROPION HCL ER (XL) 150 MG PO TB24: 150.0000 mg | ORAL_TABLET | Freq: Every morning | ORAL | 0 refills | Status: DC

## 2022-10-30 ENCOUNTER — Other Ambulatory Visit: Payer: Self-pay | Admitting: Medical

## 2023-01-12 ENCOUNTER — Other Ambulatory Visit: Payer: Self-pay | Admitting: Medical

## 2023-01-12 DIAGNOSIS — F900 Attention-deficit hyperactivity disorder, predominantly inattentive type: Secondary | ICD-10-CM

## 2023-01-12 MED ORDER — BUPROPION HCL ER (XL) 150 MG PO TB24: 150.0000 mg | ORAL_TABLET | Freq: Every morning | ORAL | 0 refills | Status: DC

## 2023-01-26 ENCOUNTER — Other Ambulatory Visit: Payer: Self-pay | Admitting: Medical

## 2023-01-29 ENCOUNTER — Encounter: Payer: Self-pay | Admitting: Medical

## 2023-01-29 MED ORDER — LOSARTAN POTASSIUM 100 MG PO TABS: 100.0000 mg | ORAL_TABLET | Freq: Every day | ORAL | 0 refills | Status: DC

## 2023-02-24 ENCOUNTER — Encounter: Payer: Self-pay | Admitting: Medical

## 2023-02-24 ENCOUNTER — Ambulatory Visit: Payer: 59 | Admitting: Medical

## 2023-02-24 VITALS — BP 146/94 | HR 84 | Ht 72.0 in | Wt 295.2 lb

## 2023-02-24 DIAGNOSIS — F900 Attention-deficit hyperactivity disorder, predominantly inattentive type: Secondary | ICD-10-CM

## 2023-02-24 DIAGNOSIS — E785 Hyperlipidemia, unspecified: Secondary | ICD-10-CM | POA: Diagnosis not present

## 2023-02-24 DIAGNOSIS — I10 Essential (primary) hypertension: Secondary | ICD-10-CM

## 2023-02-24 DIAGNOSIS — K219 Gastro-esophageal reflux disease without esophagitis: Secondary | ICD-10-CM | POA: Diagnosis not present

## 2023-02-24 LAB — COMPREHENSIVE METABOLIC PANEL
ALT: 25 U/L (ref 0–53)
AST: 21 U/L (ref 0–37)
Albumin: 4.4 g/dL (ref 3.5–5.2)
Alkaline Phosphatase: 52 U/L (ref 39–117)
BUN: 14 mg/dL (ref 6–23)
CO2: 27 mEq/L (ref 19–32)
Calcium: 9.3 mg/dL (ref 8.4–10.5)
Chloride: 104 mEq/L (ref 96–112)
Creatinine, Ser: 1.05 mg/dL (ref 0.40–1.50)
GFR: 89.28 mL/min (ref >60.00)
Glucose, Bld: 90 mg/dL (ref 70–99)
Potassium: 4 mEq/L (ref 3.5–5.1)
Sodium: 138 mEq/L (ref 135–145)
Total Bilirubin: 0.7 mg/dL (ref 0.2–1.2)
Total Protein: 7 g/dL (ref 6.0–8.3)

## 2023-02-24 LAB — LIPID PANEL
Cholesterol: 172 mg/dL (ref 0–200)
HDL: 44.3 mg/dL (ref >39.00)
LDL Cholesterol: 105 mg/dL — ABNORMAL HIGH (ref 0–99)
NonHDL: 127.95
Total CHOL/HDL Ratio: 4
Triglycerides: 113 mg/dL (ref 0.0–149.0)
VLDL: 22.6 mg/dL (ref 0.0–40.0)

## 2023-02-24 MED ORDER — BUPROPION HCL ER (XL) 150 MG PO TB24: 150.0000 mg | ORAL_TABLET | Freq: Every morning | ORAL | 3 refills | Status: DC

## 2023-02-24 MED ORDER — LOSARTAN POTASSIUM 100 MG PO TABS: 100.0000 mg | ORAL_TABLET | Freq: Every day | ORAL | 3 refills | Status: DC

## 2023-02-24 MED ORDER — OMEPRAZOLE 20 MG PO CPDR: 20.0000 mg | DELAYED_RELEASE_CAPSULE | Freq: Every day | ORAL | 3 refills | Status: AC

## 2023-02-24 NOTE — Progress Notes (Signed)
Subjective:    Patient ID: Greg Santos, male    DOB: Apr 30, 1981, 42 y.o.   MRN: [id number]  HPI Pt in for follow up.  Pt has htn- pt states 2 weeks ago when he was evaluated for st 124/80. When he checks at home will be 120/80 range. He usually checks on wed or Thursdays. No cardiac or neurologic signs/symptoms.  Pt has attention deficit/add. He is on wellbutrin and states helps him concentrate. No depression or anxiety. Pt phq-9 score 0 and gad-7 score 0.   Hyperlipidemia- last lipid panel back in 2021.    Review of Systems  Constitutional:  Negative for chills, diaphoresis, fatigue and fever.  Respiratory:  Negative for cough, chest tightness, shortness of breath and wheezing.   Cardiovascular:  Negative for chest pain and palpitations.  Gastrointestinal:  Negative for abdominal pain, constipation, diarrhea and nausea.  Genitourinary:  Negative for dysuria, flank pain and frequency.  Musculoskeletal:  Negative for back pain and gait problem.  Skin:  Negative for rash.  Neurological:  Negative for dizziness, speech difficulty, weakness, numbness and headaches.  Psychiatric/Behavioral:  Positive for decreased concentration. Negative for agitation, confusion and suicidal ideas. The patient is not nervous/anxious and is not hyperactive.        Doing well with wellbutrin for concentration/ADD.    Past Medical History:  Diagnosis Date   ADD (attention deficit disorder)    Hypertension    Obesity      Social History   Socioeconomic History   Marital status: Married    Spouse name: Not on file   Number of children: Not on file   Years of education: Not on file   Highest education level: Not on file  Occupational History   Not on file  Tobacco Use   Smoking status: Never   Smokeless tobacco: Never  Substance and Sexual Activity   Alcohol use: Yes    Alcohol/week: 1.0 standard drink of alcohol    Types: 1 Cans of beer per week    Comment: once a week    Drug use:  No   Sexual activity: Not on file  Other Topics Concern   Not on file  Social History Narrative   Not on file   Social Determinants of Health   Financial Resource Strain: Not on file  Food Insecurity: Not on file  Transportation Needs: Not on file  Physical Activity: Not on file  Stress: Not on file  Social Connections: Not on file  Intimate Partner Violence: Not on file    Past Surgical History:  Procedure Laterality Date   ESOPHAGOGASTRODUODENOSCOPY ENDOSCOPY N/A 2009    Family History  Problem Relation Age of Onset   Coronary artery disease Unknown        first degree relative   Coronary artery disease Father    Stroke Paternal Grandmother    Coronary artery disease Paternal Grandfather    Heart attack Paternal Grandfather     Allergies  Allergen Reactions   Tetracycline Rash    REACTION: Rash/Swelling    Current Outpatient Medications on File Prior to Visit  Medication Sig Dispense Refill   buPROPion (WELLBUTRIN XL) 150 MG 24 hr tablet Take 1 tablet (150 mg total) by mouth every morning. 30 tablet 0   Loratadine (ALLERGY RELIEF REDI-TABS PO) Take 1 tablet by mouth daily as needed.       losartan (COZAAR) 100 MG tablet Take 1 tablet (100 mg total) by mouth daily. 30 tablet 0  Multiple Vitamins-Minerals (MENS MULTIVITAMIN PLUS) TABS Take by mouth daily.     omeprazole (PRILOSEC) 20 MG capsule TAKE ONE CAPSULE BY MOUTH DAILY 90 capsule 0   No current facility-administered medications on file prior to visit.    BP 125/80   Pulse 84   Ht 6' (1.829 m)   Wt 295 lb 3.2 oz (133.9 kg)   BMI 40.04 kg/m            Objective:   Physical Exam  General- No acute distress. Pleasant patient. Neck- Full range of motion, no jvd Lungs- Clear, even and unlabored. Heart- regular rate and rhythm. Neurologic- CNII- XII grossly intact.       Assessment & Plan:   Patient Instructions  1. Attention deficit hyperactivity disorder (ADHD), predominantly inattentive  type Doing well with wellbutrin. Continue.   2. Essential hypertension Bp well controlled at home and on recheck. Continue losartan 100 mg daily.  3. Hyperlipidemia, unspecified hyperlipidemia type  Get cmp and lipid panel today. Advise low cholesterol diet and follow lab results.   4. Gerd controlled with omeprazole. Continue .  Follow up 1 yr or sooner if needed per lab review or how you are doing. Please check bp to make sure controlled if follow up in one year.   Esperanza Richters, PA-C

## 2023-02-24 NOTE — Patient Instructions (Addendum)
1. Attention deficit hyperactivity disorder (ADHD), predominantly inattentive type Doing well with wellbutrin. Continue.   2. Essential hypertension Bp well controlled at home and on recheck. Continue losartan 100 mg daily.  3. Hyperlipidemia, unspecified hyperlipidemia type  Get cmp and lipid panel today. Advise low cholesterol diet and follow lab results.   4. Gerd controlled with omeprazole. Continue .  Follow up 1 yr or sooner if needed per lab review or how you are doing. Please check bp to make sure controlled if follow up in one year.

## 2023-02-26 ENCOUNTER — Ambulatory Visit: Payer: 59 | Admitting: Medical

## 2023-03-01 ENCOUNTER — Other Ambulatory Visit: Payer: Self-pay | Admitting: Medical

## 2024-02-21 ENCOUNTER — Other Ambulatory Visit: Payer: Self-pay | Admitting: Medical

## 2024-02-21 DIAGNOSIS — F900 Attention-deficit hyperactivity disorder, predominantly inattentive type: Secondary | ICD-10-CM

## 2024-02-22 ENCOUNTER — Encounter: Payer: Self-pay | Admitting: Medical

## 2024-02-23 ENCOUNTER — Other Ambulatory Visit: Payer: Self-pay | Admitting: Medical

## 2024-05-20 ENCOUNTER — Other Ambulatory Visit: Payer: Self-pay | Admitting: Medical

## 2024-05-20 DIAGNOSIS — F900 Attention-deficit hyperactivity disorder, predominantly inattentive type: Secondary | ICD-10-CM

## 2024-07-29 ENCOUNTER — Ambulatory Visit: Admitting: Medical

## 2024-07-29 ENCOUNTER — Encounter: Payer: Self-pay | Admitting: Medical

## 2024-07-29 VITALS — BP 130/78 | HR 87 | Temp 98.3°F | Resp 14 | Ht 72.0 in | Wt 245.0 lb

## 2024-07-29 DIAGNOSIS — I10 Essential (primary) hypertension: Secondary | ICD-10-CM | POA: Diagnosis not present

## 2024-07-29 DIAGNOSIS — Z0000 Encounter for general adult medical examination without abnormal findings: Secondary | ICD-10-CM

## 2024-07-29 DIAGNOSIS — Z0184 Encounter for antibody response examination: Secondary | ICD-10-CM

## 2024-07-29 DIAGNOSIS — F900 Attention-deficit hyperactivity disorder, predominantly inattentive type: Secondary | ICD-10-CM

## 2024-07-29 MED ORDER — BUPROPION HCL ER (XL) 150 MG PO TB24: 150.0000 mg | ORAL_TABLET | Freq: Every morning | ORAL | 0 refills | Status: DC

## 2024-07-29 MED ORDER — LOSARTAN POTASSIUM 100 MG PO TABS: 100.0000 mg | ORAL_TABLET | Freq: Every day | ORAL | 3 refills | Status: AC

## 2024-07-29 MED ORDER — BUPROPION HCL ER (XL) 150 MG PO TB24: 150.0000 mg | ORAL_TABLET | Freq: Every morning | ORAL | 3 refills | Status: AC

## 2024-07-29 MED ORDER — LOSARTAN POTASSIUM 100 MG PO TABS: 100.0000 mg | ORAL_TABLET | Freq: Every day | ORAL | 1 refills | Status: DC

## 2024-07-29 NOTE — Addendum Note (Signed)
 Addended by: TRUDY CURVIN RAMAN on: 07/29/2024 03:41 PM   Modules accepted: Orders

## 2024-07-29 NOTE — Patient Instructions (Addendum)
 For you wellness exam today I have ordered cbc, cmp, hep b surface antibody and  lipid panel.  Flu vaccine deferred.  Recommend exercise and healthy diet.  We will let you know lab results as they come in.  Follow up date appointment will be determined after lab review.    Htn- bp well controlled. Losartan   Adhd- refilled wellbutrin  as has worked.  Weight loss recenlty with zepbound   Preventive Care 73-41 Years Old, Male Preventive care refers to lifestyle choices and visits with your health care provider that can promote health and wellness. Preventive care visits are also called wellness exams. What can I expect for my preventive care visit? Counseling During your preventive care visit, your health care provider may ask about your: Medical history, including: Past medical problems. Family medical history. Current health, including: Emotional well-being. Home life and relationship well-being. Sexual activity. Lifestyle, including: Alcohol, nicotine  or tobacco, and drug use. Access to firearms. Diet, exercise, and sleep habits. Safety issues such as seatbelt and bike helmet use. Sunscreen use. Work and work Astronomer. Physical exam Your health care provider may check your: Height and weight. These may be used to calculate your BMI (body mass index). BMI is a measurement that tells if you are at a healthy weight. Waist circumference. This measures the distance around your waistline. This measurement also tells if you are at a healthy weight and may help predict your risk of certain diseases, such as type 2 diabetes and high blood pressure. Heart rate and blood pressure. Body temperature. Skin for abnormal spots. What immunizations do I need?  Vaccines are usually given at various ages, according to a schedule. Your health care provider will recommend vaccines for you based on your age, medical history, and lifestyle or other factors, such as travel or where you  work. What tests do I need? Screening Your health care provider may recommend screening tests for certain conditions. This may include: Lipid and cholesterol levels. Diabetes screening. This is done by checking your blood sugar (glucose) after you have not eaten for a while (fasting). Hepatitis B test. Hepatitis C test. HIV (human immunodeficiency virus) test. STI (sexually transmitted infection) testing, if you are at risk. Talk with your health care provider about your test results, treatment options, and if necessary, the need for more tests. Follow these instructions at home: Eating and drinking  Eat a healthy diet that includes fresh fruits and vegetables, whole grains, lean protein, and low-fat dairy products. Drink enough fluid to keep your urine pale yellow. Take vitamin and mineral supplements as recommended by your health care provider. Do not drink alcohol if your health care provider tells you not to drink. If you drink alcohol: Limit how much you have to 0-2 drinks a day. Know how much alcohol is in your drink. In the [place] one drink equals one 12 oz bottle of beer (355 mL), one 5 oz glass of wine (148 mL), or one 1 oz glass of hard liquor (44 mL). Lifestyle Brush your teeth every morning and night with fluoride toothpaste. Floss one time each day. Exercise for at least 30 minutes 5 or more days each week. Do not use any products that contain nicotine  or tobacco. These products include cigarettes, chewing tobacco, and vaping devices, such as e-cigarettes. If you need help quitting, ask your health care provider. Do not use drugs. If you are sexually active, practice safe sex. Use a condom or other form of protection to prevent STIs. Find healthy ways  to manage stress, such as: Meditation, yoga, or listening to music. Journaling. Talking to a trusted person. Spending time with friends and family. Minimize exposure to UV radiation to reduce your risk of skin  cancer. Safety Always wear your seat belt while driving or riding in a vehicle. Do not drive: If you have been drinking alcohol. Do not ride with someone who has been drinking. If you have been using any mind-altering substances or drugs. While texting. When you are tired or distracted. Wear a helmet and other protective equipment during sports activities. If you have firearms in your house, make sure you follow all gun safety procedures. Seek help if you have been physically or sexually abused. What's next? Go to your health care provider once a year for an annual wellness visit. Ask your health care provider how often you should have your eyes and teeth checked. Stay up to date on all vaccines. This information is not intended to replace advice given to you by your health care provider. Make sure you discuss any questions you have with your health care provider. Document Revised: 04/24/2021 Document Reviewed: 04/24/2021 Elsevier Patient Education  2024 [employer].

## 2024-07-29 NOTE — Progress Notes (Signed)
 Subjective:    Patient ID: Greg Santos, male    DOB: May 10, 1981, 43 y.o.   MRN: [id number]  HPI   Pt in for his wellness exam.   Pt works as Radio producer, Pt has not been exercising over past 2 months. States might be traveling for work in near future. Admits not eating healthy presenty when on the road. Married and has one child 43 years old.    Pt declines flu vaccine today. He states will get thru pharmacy.   Pt did get covid.    Pt does not smoke.    Pt mentions that he took zepbound for 7 months. He lost about 55 lbs. He is of med know. Pt been offf of med for about a month. Pt never had pancreatitis. No family hx of thyroid  cancer. Pt got med from weight loss company thru NP locally.  BMI was 40 and now 33.    Htn- bp better controlled on recheck. On losartan  100 mg daily  ADHD- for years historically has been controlled with Wellbutrin .      Review of Systems  Constitutional:  Negative for chills, fatigue and fever.  Respiratory:  Negative for chest tightness, shortness of breath and wheezing.   Cardiovascular:  Negative for chest pain and palpitations.  Gastrointestinal:  Negative for abdominal pain and blood in stool.  Genitourinary:  Negative for dysuria and enuresis.  Musculoskeletal:  Negative for back pain.  Skin:  Negative for rash.  Neurological:  Negative for dizziness, syncope, weakness and numbness.  Hematological:  Negative for adenopathy. Does not bruise/bleed easily.  Psychiatric/Behavioral:  Negative for behavioral problems, hallucinations and sleep disturbance.     Past Medical History:  Diagnosis Date   ADD (attention deficit disorder)    Hypertension    Obesity      Social History   Socioeconomic History   Marital status: Married    Spouse name: Not on file   Number of children: Not on file   Years of education: Not on file   Highest education level: Bachelor's degree (e.g., BA, AB, BS)  Occupational History   Not on file   Tobacco Use   Smoking status: Never   Smokeless tobacco: Never  Substance and Sexual Activity   Alcohol use: Yes    Alcohol/week: 1.0 standard drink of alcohol    Types: 1 Cans of beer per week    Comment: once a week    Drug use: No   Sexual activity: Not on file  Other Topics Concern   Not on file  Social History Narrative   Not on file   Social Drivers of Health   Financial Resource Strain: Low Risk  (07/27/2024)   Overall Financial Resource Strain (CARDIA)    Difficulty of Paying Living Expenses: Not hard at all  Food Insecurity: No Food Insecurity (07/27/2024)   Hunger Vital Sign    Worried About Running Out of Food in the Last Year: Never true    Ran Out of Food in the Last Year: Never true  Transportation Needs: No Transportation Needs (07/27/2024)   PRAPARE - Administrator, Civil Service (Medical): No    Lack of Transportation (Non-Medical): No  Physical Activity: Sufficiently Active (07/27/2024)   Exercise Vital Sign    Days of Exercise per Week: 3 days    Minutes of Exercise per Session: 50 min  Stress: No Stress Concern Present (07/27/2024)   [facility] [employer] - Occupational Stress Questionnaire  Feeling of Stress: Only a little  Social Connections: Moderately Integrated (07/27/2024)   Social Connection and Isolation Panel    Frequency of Communication with Friends and Family: Once a week    Frequency of Social Gatherings with Friends and Family: Once a week    Attends Religious Services: More than 4 times per year    Active Member of [employer] or Organizations: Yes    Attends Banker Meetings: More than 4 times per year    Marital Status: Married  Catering manager Violence: Unknown (02/14/2022)   Received from [facility]   HITS    Physically Hurt: Not on file    Insult or Talk Down To: Not on file    Threaten Physical Harm: Not on file    Scream or Curse: Not on file    Past Surgical History:  Procedure  Laterality Date   ESOPHAGOGASTRODUODENOSCOPY ENDOSCOPY N/A 2009    Family History  Problem Relation Age of Onset   Coronary artery disease Unknown        first degree relative   Coronary artery disease Father    Stroke Paternal Grandmother    Coronary artery disease Paternal Grandfather    Heart attack Paternal Grandfather     Allergies  Allergen Reactions   Tetracycline Rash    REACTION: Rash/Swelling    Current Outpatient Medications on File Prior to Visit  Medication Sig Dispense Refill   Loratadine (ALLERGY RELIEF REDI-TABS PO) Take 1 tablet by mouth daily as needed.       Multiple Vitamins-Minerals (MENS MULTIVITAMIN PLUS) TABS Take by mouth daily.     omeprazole  (PRILOSEC ) 20 MG capsule Take 1 capsule (20 mg total) by mouth daily. 90 capsule 3   No current facility-administered medications on file prior to visit.    BP 130/78   Pulse 100   Temp 98.3 F (36.8 C) (Oral)   Resp 14   Ht 6' (1.829 m)   Wt 245 lb (111.1 kg)   SpO2 98%   BMI 33.23 kg/m        Objective:   Physical Exam  General Mental Status- Alert. General Appearance- Not in acute distress.   Skin General: Color- Normal Color. Moisture- Normal Moisture.  Neck Carotid Arteries- Normal color. Moisture- Normal Moisture. No carotid bruits. No JVD.  Chest and Lung Exam Auscultation: Breath Sounds:-Normal.  Cardiovascular Auscultation:Rythm- Regular. Murmurs & Other Heart Sounds:Auscultation of the heart reveals- No Murmurs.  Abdomen Inspection:-Inspeection Normal. Palpation/Percussion:Note:No mass. Palpation and Percussion of the abdomen reveal- Non Tender, Non Distended + BS, no rebound or guarding.   Neurologic Cranial Nerve exam:- CN III-XII intact(No nystagmus), symmetric smile. Strength:- 5/5 equal and symmetric strength both upper and lower extremities.       Assessment & Plan:   Patient Instructions  For you wellness exam today I have ordered cbc, cmp, hep b surface  antibody and  lipid panel.  Flu vaccine deferred.  Recommend exercise and healthy diet.  We will let you know lab results as they come in.  Follow up date appointment will be determined after lab review.    Htn- bp well controlled.  Adhd- refilled wellbutrin  as has worked.  Weight loss recenlty with zepbound     [name], PA-C

## 2024-07-30 ENCOUNTER — Ambulatory Visit: Payer: Self-pay | Admitting: Medical

## 2024-07-30 LAB — CBC WITH DIFFERENTIAL/PLATELET
Absolute Lymphocytes: 2176 {cells}/uL (ref 850–3900)
Absolute Monocytes: 619 {cells}/uL (ref 200–950)
Basophils Absolute: 77 {cells}/uL (ref 0–200)
Basophils Relative: 0.9 %
Eosinophils Absolute: 86 {cells}/uL (ref 15–500)
Eosinophils Relative: 1 %
HCT: 43.4 % (ref 38.5–50.0)
Hemoglobin: 14.4 g/dL (ref 13.2–17.1)
MCH: 29.9 pg (ref 27.0–33.0)
MCHC: 33.2 g/dL (ref 32.0–36.0)
MCV: 90.2 fL (ref 80.0–100.0)
MPV: 11.3 fL (ref 7.5–12.5)
Monocytes Relative: 7.2 %
Neutro Abs: 5642 {cells}/uL (ref 1500–7800)
Neutrophils Relative %: 65.6 %
Platelets: 277 Thousand/uL (ref 140–400)
RBC: 4.81 Million/uL (ref 4.20–5.80)
RDW: 12.2 % (ref 11.0–15.0)
Total Lymphocyte: 25.3 %
WBC: 8.6 Thousand/uL (ref 3.8–10.8)

## 2024-07-30 LAB — COMPREHENSIVE METABOLIC PANEL WITH GFR
AG Ratio: 1.6 (calc) (ref 1.0–2.5)
ALT: 25 U/L (ref 9–46)
AST: 31 U/L (ref 10–40)
Albumin: 4.7 g/dL (ref 3.6–5.1)
Alkaline phosphatase (APISO): 53 U/L (ref 36–130)
BUN: 14 mg/dL (ref 7–25)
CO2: 23 mmol/L (ref 20–32)
Calcium: 9.9 mg/dL (ref 8.6–10.3)
Chloride: 104 mmol/L (ref 98–110)
Creat: 1.09 mg/dL (ref 0.60–1.29)
Globulin: 3 g/dL (ref 1.9–3.7)
Glucose, Bld: 79 mg/dL (ref 65–99)
Potassium: 4.5 mmol/L (ref 3.5–5.3)
Sodium: 139 mmol/L (ref 135–146)
Total Bilirubin: 1 mg/dL (ref 0.2–1.2)
Total Protein: 7.7 g/dL (ref 6.1–8.1)
eGFR: 87 mL/min/1.73m2 (ref >=60)

## 2024-07-30 LAB — LIPID PANEL
Cholesterol: 203 mg/dL — ABNORMAL HIGH (ref <200)
HDL: 53 mg/dL (ref >=40)
LDL Cholesterol (Calc): 133 mg/dL — ABNORMAL HIGH
Non-HDL Cholesterol (Calc): 150 mg/dL — ABNORMAL HIGH (ref <130)
Total CHOL/HDL Ratio: 3.8 (calc) (ref <5.0)
Triglycerides: 76 mg/dL (ref <150)

## 2024-07-30 LAB — HEPATITIS B SURFACE ANTIBODY, QUANTITATIVE: Hep B S AB Quant (Post): <5 m[IU]/mL — ABNORMAL LOW (ref >=10)
